# Patient Record
Sex: Female | Born: 1985
Health system: Southern US, Community
[De-identification: ages and names within clinical notes are randomized; demographics above are authoritative.]

## PROBLEM LIST (undated history)

## (undated) DIAGNOSIS — F419 Anxiety disorder, unspecified: Secondary | ICD-10-CM

## (undated) DIAGNOSIS — F32A Depression, unspecified: Secondary | ICD-10-CM

## (undated) DIAGNOSIS — R519 Headache, unspecified: Secondary | ICD-10-CM

## (undated) DIAGNOSIS — K644 Residual hemorrhoidal skin tags: Secondary | ICD-10-CM

## (undated) HISTORY — PX: CLAVICLE SURGERY: SHX598

## (undated) HISTORY — DX: Headache, unspecified: R51.9

## (undated) HISTORY — DX: Anxiety disorder, unspecified: F41.9

## (undated) HISTORY — PX: WISDOM TOOTH EXTRACTION: SHX21

---

## 2020-02-08 ENCOUNTER — Inpatient Hospital Stay (HOSPITAL_COMMUNITY): Admit: 2020-02-08 | Payer: Managed Care, Other (non HMO) | Admitting: Obstetrics and Gynecology

## 2020-02-23 NOTE — L&D Delivery Note (Addendum)
Operative Delivery Note Pt reached complete dilation and FHR noted to have more prolonged decelerations with slow recovery.  She pushed well and brought the vertex to a +2 station after about 40 minutes, but the FHR began to have more prolonged decelerations under 100.  The patient and husband were counseled that we need to expedite delivery.   Verbal consent: obtained from patient.  Risks and benefits discussed in detail.  Risks include, but are not limited to the risks of anesthesia, bleeding, infection, damage to maternal tissues, fetal cephalhematoma.  There is also the risk of inability to effect vaginal delivery of the head, or shoulder dystocia that cannot be resolved by established maneuvers, leading to the need for emergency cesarean section.    A soft bell cup vacuum was appled to a +2 station and in the green zone with no popoffs the vertex delivered to crowning over 3 contractions.  At 12:57 AM a viable female was delivered via Vaginal, Vacuum Investment banker, operational).  Presentation: vertex; Position: Right,, Occiput,, Anterior; Station: +2.  APGAR: 9, 9; weight  pending.   Placenta status: delivered spontaneously.   Cord:  with the following complications: none.  Anesthesia:  Epidural Instruments: Soft Bell cup vacuum Episiotomy: None Lacerations:  Second degree perineal, some mild rectal prolapse Suture Repair: 3.0 vicryl rapide Est. Blood Loss (mL):   Mom to postpartum.  Baby to Couplet care / Skin to Skin.  Parents decline circumcision.  Chelsea Gonzalez 09/11/2020, 1:51 AM

## 2020-02-28 ENCOUNTER — Other Ambulatory Visit: Payer: Self-pay | Admitting: Obstetrics and Gynecology

## 2020-02-28 DIAGNOSIS — O09291 Supervision of pregnancy with other poor reproductive or obstetric history, first trimester: Secondary | ICD-10-CM

## 2020-04-25 ENCOUNTER — Ambulatory Visit: Payer: 59 | Attending: Obstetrics and Gynecology

## 2020-04-25 ENCOUNTER — Other Ambulatory Visit: Payer: Self-pay | Admitting: Obstetrics and Gynecology

## 2020-04-25 ENCOUNTER — Other Ambulatory Visit: Payer: Self-pay

## 2020-04-25 ENCOUNTER — Ambulatory Visit: Payer: 59 | Admitting: *Deleted

## 2020-04-25 ENCOUNTER — Encounter: Payer: Self-pay | Admitting: *Deleted

## 2020-04-25 ENCOUNTER — Ambulatory Visit (HOSPITAL_BASED_OUTPATIENT_CLINIC_OR_DEPARTMENT_OTHER): Payer: 59 | Admitting: Obstetrics and Gynecology

## 2020-04-25 VITALS — BP 107/69 | HR 80 | Ht 69.0 in

## 2020-04-25 DIAGNOSIS — O09529 Supervision of elderly multigravida, unspecified trimester: Secondary | ICD-10-CM

## 2020-04-25 DIAGNOSIS — Z3A19 19 weeks gestation of pregnancy: Secondary | ICD-10-CM | POA: Insufficient documentation

## 2020-04-25 DIAGNOSIS — O09219 Supervision of pregnancy with history of pre-term labor, unspecified trimester: Secondary | ICD-10-CM | POA: Insufficient documentation

## 2020-04-25 DIAGNOSIS — O09212 Supervision of pregnancy with history of pre-term labor, second trimester: Secondary | ICD-10-CM | POA: Diagnosis not present

## 2020-04-25 DIAGNOSIS — O09522 Supervision of elderly multigravida, second trimester: Secondary | ICD-10-CM

## 2020-04-25 DIAGNOSIS — O09291 Supervision of pregnancy with other poor reproductive or obstetric history, first trimester: Secondary | ICD-10-CM | POA: Diagnosis present
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
## 2020-05-16 ENCOUNTER — Other Ambulatory Visit: Payer: Self-pay | Admitting: Obstetrics

## 2020-05-16 ENCOUNTER — Other Ambulatory Visit: Payer: Self-pay

## 2020-05-16 ENCOUNTER — Encounter: Payer: Self-pay | Admitting: *Deleted

## 2020-05-16 ENCOUNTER — Ambulatory Visit: Payer: 59 | Admitting: *Deleted

## 2020-05-16 ENCOUNTER — Ambulatory Visit: Payer: 59 | Attending: Obstetrics and Gynecology

## 2020-05-16 VITALS — BP 115/70 | HR 69

## 2020-05-16 DIAGNOSIS — O09219 Supervision of pregnancy with history of pre-term labor, unspecified trimester: Secondary | ICD-10-CM | POA: Diagnosis not present

## 2020-05-16 DIAGNOSIS — O09299 Supervision of pregnancy with other poor reproductive or obstetric history, unspecified trimester: Secondary | ICD-10-CM

## 2020-05-16 DIAGNOSIS — O09523 Supervision of elderly multigravida, third trimester: Secondary | ICD-10-CM

## 2020-05-16 DIAGNOSIS — O09522 Supervision of elderly multigravida, second trimester: Secondary | ICD-10-CM | POA: Insufficient documentation

## 2020-06-27 ENCOUNTER — Ambulatory Visit: Payer: 59 | Admitting: *Deleted

## 2020-06-27 ENCOUNTER — Other Ambulatory Visit: Payer: Self-pay

## 2020-06-27 ENCOUNTER — Encounter: Payer: Self-pay | Admitting: *Deleted

## 2020-06-27 ENCOUNTER — Ambulatory Visit: Payer: 59 | Attending: Obstetrics

## 2020-06-27 VITALS — BP 110/59 | HR 69

## 2020-06-27 DIAGNOSIS — O09523 Supervision of elderly multigravida, third trimester: Secondary | ICD-10-CM

## 2020-06-27 DIAGNOSIS — O09299 Supervision of pregnancy with other poor reproductive or obstetric history, unspecified trimester: Secondary | ICD-10-CM | POA: Insufficient documentation

## 2020-06-27 DIAGNOSIS — O09219 Supervision of pregnancy with history of pre-term labor, unspecified trimester: Secondary | ICD-10-CM | POA: Insufficient documentation

## 2020-08-22 LAB — OB RESULTS CONSOLE GBS: GBS: NEGATIVE

## 2020-09-03 ENCOUNTER — Telehealth (HOSPITAL_COMMUNITY): Payer: Self-pay | Admitting: *Deleted

## 2020-09-03 ENCOUNTER — Other Ambulatory Visit: Payer: Self-pay | Admitting: Advanced Practice Midwife

## 2020-09-03 ENCOUNTER — Encounter (HOSPITAL_COMMUNITY): Payer: Self-pay | Admitting: *Deleted

## 2020-09-03 NOTE — Telephone Encounter (Signed)
Preadmission screen  

## 2020-09-08 ENCOUNTER — Other Ambulatory Visit (HOSPITAL_COMMUNITY)
Admission: RE | Admit: 2020-09-08 | Discharge: 2020-09-08 | Disposition: A | Discharge status: Home / Self Care | Payer: 59 | Source: Ambulatory Visit | Attending: Obstetrics and Gynecology | Admitting: Obstetrics and Gynecology

## 2020-09-08 DIAGNOSIS — O872 Hemorrhoids in the puerperium: Secondary | ICD-10-CM | POA: Diagnosis not present

## 2020-09-08 DIAGNOSIS — Z3A39 39 weeks gestation of pregnancy: Secondary | ICD-10-CM | POA: Diagnosis not present

## 2020-09-08 DIAGNOSIS — Z8616 Personal history of COVID-19: Secondary | ICD-10-CM | POA: Diagnosis not present

## 2020-09-08 DIAGNOSIS — Z01812 Encounter for preprocedural laboratory examination: Secondary | ICD-10-CM | POA: Insufficient documentation

## 2020-09-08 DIAGNOSIS — Z20822 Contact with and (suspected) exposure to covid-19: Secondary | ICD-10-CM | POA: Insufficient documentation

## 2020-09-08 LAB — SARS CORONAVIRUS 2 (TAT 6-24 HRS): SARS Coronavirus 2: NEGATIVE

## 2020-09-09 ENCOUNTER — Other Ambulatory Visit: Payer: Self-pay | Admitting: Obstetrics and Gynecology

## 2020-09-10 ENCOUNTER — Inpatient Hospital Stay (HOSPITAL_COMMUNITY)
Admission: AD | Admit: 2020-09-10 | Discharge: 2020-09-12 | DRG: 806 | Disposition: A | Discharge status: Home / Self Care | Payer: 59 | Attending: Obstetrics and Gynecology | Admitting: Obstetrics and Gynecology

## 2020-09-10 ENCOUNTER — Encounter (HOSPITAL_COMMUNITY): Payer: Self-pay | Admitting: Obstetrics and Gynecology

## 2020-09-10 ENCOUNTER — Other Ambulatory Visit: Payer: Self-pay

## 2020-09-10 ENCOUNTER — Inpatient Hospital Stay (HOSPITAL_COMMUNITY): Payer: 59

## 2020-09-10 ENCOUNTER — Inpatient Hospital Stay (HOSPITAL_COMMUNITY): Payer: 59 | Admitting: Anesthesiology

## 2020-09-10 DIAGNOSIS — Z3A39 39 weeks gestation of pregnancy: Secondary | ICD-10-CM

## 2020-09-10 DIAGNOSIS — O26893 Other specified pregnancy related conditions, third trimester: Secondary | ICD-10-CM | POA: Diagnosis present

## 2020-09-10 DIAGNOSIS — K645 Perianal venous thrombosis: Secondary | ICD-10-CM | POA: Diagnosis not present

## 2020-09-10 DIAGNOSIS — Z20822 Contact with and (suspected) exposure to covid-19: Secondary | ICD-10-CM | POA: Diagnosis present

## 2020-09-10 DIAGNOSIS — Z8616 Personal history of COVID-19: Secondary | ICD-10-CM | POA: Diagnosis not present

## 2020-09-10 DIAGNOSIS — O872 Hemorrhoids in the puerperium: Secondary | ICD-10-CM | POA: Diagnosis not present

## 2020-09-10 LAB — TYPE AND SCREEN
ABO/RH(D): A POS
Antibody Screen: NEGATIVE

## 2020-09-10 LAB — CBC
HCT: 37.2 % (ref 36.0–46.0)
Hemoglobin: 12.1 g/dL (ref 12.0–15.0)
MCH: 29.9 pg (ref 26.0–34.0)
MCHC: 32.5 g/dL (ref 30.0–36.0)
MCV: 91.9 fL (ref 80.0–100.0)
Platelets: 179 10*3/uL (ref 150–400)
RBC: 4.05 MIL/uL (ref 3.87–5.11)
RDW: 14.2 % (ref 11.5–15.5)
WBC: 14.9 10*3/uL — ABNORMAL HIGH (ref 4.0–10.5)
nRBC: 0 % (ref 0.0–0.2)

## 2020-09-10 MED ORDER — LACTATED RINGERS IV SOLN: INTRAVENOUS | Status: DC

## 2020-09-10 MED ORDER — ONDANSETRON HCL 4 MG/2ML IJ SOLN: 4.0000 mg | Freq: Four times a day (QID) | INTRAMUSCULAR | Status: DC | PRN

## 2020-09-10 MED ORDER — PHENYLEPHRINE 40 MCG/ML (10ML) SYRINGE FOR IV PUSH (FOR BLOOD PRESSURE SUPPORT)
80.0000 ug | PREFILLED_SYRINGE | INTRAVENOUS | Status: DC | PRN
  Filled 2020-09-10: qty 10

## 2020-09-10 MED ORDER — DIPHENHYDRAMINE HCL 50 MG/ML IJ SOLN
12.5000 mg | INTRAMUSCULAR | Status: DC | PRN
Start: 2020-09-10 — End: 2020-09-11

## 2020-09-10 MED ORDER — OXYTOCIN-SODIUM CHLORIDE 30-0.9 UT/500ML-% IV SOLN
2.5000 [IU]/h | INTRAVENOUS | Status: DC
  Administered 2020-09-11: 2.5 [IU]/h via INTRAVENOUS

## 2020-09-10 MED ORDER — LACTATED RINGERS IV SOLN
500.0000 mL | INTRAVENOUS | Status: DC | PRN
Start: 2020-09-10 — End: 2020-09-11

## 2020-09-10 MED ORDER — OXYCODONE-ACETAMINOPHEN 5-325 MG PO TABS
2.0000 | ORAL_TABLET | ORAL | Status: DC | PRN
Start: 2020-09-10 — End: 2020-09-11

## 2020-09-10 MED ORDER — FENTANYL-BUPIVACAINE-NACL 0.5-0.125-0.9 MG/250ML-% EP SOLN
12.0000 mL/h | EPIDURAL | Status: DC | PRN
  Administered 2020-09-10: 13 mL/h via EPIDURAL
  Filled 2020-09-10: qty 250

## 2020-09-10 MED ORDER — ACETAMINOPHEN 325 MG PO TABS
650.0000 mg | ORAL_TABLET | ORAL | Status: DC | PRN
  Administered 2020-09-11: 650 mg via ORAL
  Filled 2020-09-10: qty 2

## 2020-09-10 MED ORDER — BUTORPHANOL TARTRATE 1 MG/ML IJ SOLN: 1.0000 mg | INTRAMUSCULAR | Status: DC | PRN

## 2020-09-10 MED ORDER — OXYTOCIN BOLUS FROM INFUSION
333.0000 mL | Freq: Once | INTRAVENOUS | Status: AC
  Administered 2020-09-11: 333 mL via INTRAVENOUS

## 2020-09-10 MED ORDER — OXYCODONE-ACETAMINOPHEN 5-325 MG PO TABS
1.0000 | ORAL_TABLET | ORAL | Status: DC | PRN
Start: 2020-09-10 — End: 2020-09-11

## 2020-09-10 MED ORDER — TERBUTALINE SULFATE 1 MG/ML IJ SOLN: 0.2500 mg | Freq: Once | INTRAMUSCULAR | Status: DC | PRN

## 2020-09-10 MED ORDER — SOD CITRATE-CITRIC ACID 500-334 MG/5ML PO SOLN: 30.0000 mL | ORAL | Status: DC | PRN

## 2020-09-10 MED ORDER — EPHEDRINE 5 MG/ML INJ: 10.0000 mg | INTRAVENOUS | Status: DC | PRN

## 2020-09-10 MED ORDER — LIDOCAINE HCL (PF) 1 % IJ SOLN
INTRAMUSCULAR | Status: DC | PRN
  Administered 2020-09-10: 5 mL via EPIDURAL
  Administered 2020-09-10: 4 mL via EPIDURAL

## 2020-09-10 MED ORDER — PHENYLEPHRINE 40 MCG/ML (10ML) SYRINGE FOR IV PUSH (FOR BLOOD PRESSURE SUPPORT)
80.0000 ug | PREFILLED_SYRINGE | INTRAVENOUS | Status: DC | PRN
  Administered 2020-09-10 (×2): 80 ug via INTRAVENOUS

## 2020-09-10 MED ORDER — LIDOCAINE HCL (PF) 1 % IJ SOLN: 30.0000 mL | INTRAMUSCULAR | Status: DC | PRN

## 2020-09-10 MED ORDER — LACTATED RINGERS IV SOLN
500.0000 mL | Freq: Once | INTRAVENOUS | Status: AC
  Administered 2020-09-10: 500 mL via INTRAVENOUS

## 2020-09-10 MED ORDER — OXYTOCIN-SODIUM CHLORIDE 30-0.9 UT/500ML-% IV SOLN
1.0000 m[IU]/min | INTRAVENOUS | Status: DC
  Administered 2020-09-10: 2 m[IU]/min via INTRAVENOUS
  Filled 2020-09-10: qty 500

## 2020-09-10 NOTE — Progress Notes (Signed)
Patient ID: Chelsea Gonzalez, female   DOB: Jul 07, 1985, 35 y.o.   MRN: [id number] Pt comfortable with epidural  Afeb VSS  FHR category 1  Cervix 70/3-4/-1  IUPC placed to titrate up pitocin Follow progress.

## 2020-09-10 NOTE — Anesthesia Preprocedure Evaluation (Signed)
Anesthesia Evaluation  Patient identified by MRN, date of birth, ID band Patient awake    Reviewed: Allergy & Precautions, Patient's Chart, lab work & pertinent test results  Airway Mallampati: II  TM Distance: >3 FB Neck ROM: Full    Dental no notable dental hx. (+) Teeth Intact   Pulmonary neg pulmonary ROS,    Pulmonary exam normal breath sounds clear to auscultation       Cardiovascular negative cardio ROS Normal cardiovascular exam Rhythm:Regular Rate:Normal     Neuro/Psych  Headaches, Anxiety    GI/Hepatic Neg liver ROS, GERD  ,  Endo/Other  Obesity  Renal/GU negative Renal ROS  negative genitourinary   Musculoskeletal negative musculoskeletal ROS (+)   Abdominal (+) + obese,   Peds  Hematology negative hematology ROS (+)   Anesthesia Other Findings   Reproductive/Obstetrics (+) Pregnancy                             Anesthesia Physical Anesthesia Plan  ASA: 2  Anesthesia Plan: Epidural   Post-op Pain Management:    Induction:   PONV Risk Score and Plan:   Airway Management Planned: Natural Airway  Additional Equipment:   Intra-op Plan:   Post-operative Plan:   Informed Consent: I have reviewed the patients History and Physical, chart, labs and discussed the procedure including the risks, benefits and alternatives for the proposed anesthesia with the patient or authorized representative who has indicated his/her understanding and acceptance.       Plan Discussed with: Anesthesiologist  Anesthesia Plan Comments:         Anesthesia Quick Evaluation

## 2020-09-10 NOTE — Progress Notes (Signed)
Patient ID: Chelsea Gonzalez, female   DOB: 01/12/1986, 35 y.o.   MRN: [id number] Pt feeling a bit more uncomfortable, may want epidural in next hour  FHR Category 1  Cervix 70/2-3/-2  Continue to titrate pitocin Epidural prn.
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
## 2020-09-10 NOTE — H&P (Signed)
Chelsea Gonzalez is a 35 y.o. female G2P0101 at 4 1/7 weeks (EDD 09/16/20 by LMP c/w 8 week [place]) presenting for IOL at term. Prenatal care significant GUR:KYHCWCB of premature delivery   1) h/o Preterm Delivery PPROM at 69 weeks,in hospital until induced at 34 weeks (NICU 2 1/2 weeks) Pt did 17-P weekly at home 16-37 weeks   2) Advanced maternal age gravida  NIPT low risk   3) COVID 2/22  mild/moderate symptoms (was vaccinated)  4) h/o rectal prolapse following last delivery requires pelvic PT  d/w Dr. Maisie Fus of [facility] and she states probably 50/50 as to whether a vaginal delivery would make prolapse worse, more likely with prolonged pushing but does not think contraindicated  OB History     Gravida  2   Para  1   Term      Preterm  1   AB      Living  1      SAB      IAB      Ectopic      Multiple      Live Births  1         05-05-2017, 34 wks M, 3lbs 14oz, Vaginal Delivery  Past Medical History:  Diagnosis Date   Anxiety    Headache    migraine w/aura occas   Past Surgical History:  Procedure Laterality Date   CLAVICLE SURGERY     WISDOM TOOTH EXTRACTION     Family History: family history includes Cancer in her mother; Diabetes in her mother; Hypertension in her father and mother; Hypothyroidism in her mother; Stroke in her father. Social History:  reports that she has never smoked. She has never used smokeless tobacco. She reports that she does not drink alcohol and does not use drugs.     Maternal Diabetes: No Genetic Screening: Normal Maternal Ultrasounds/Referrals: Normal Fetal Ultrasounds or other Referrals:  None Maternal Substance Abuse:  No Significant Maternal Medications:  None Significant Maternal Lab Results:  Group B Strep negative Other Comments:  None  Review of Systems  Constitutional:  Negative for fever.  Respiratory:  Negative for shortness of breath.   Gastrointestinal:  Negative for abdominal pain.  Genitourinary:   Negative for vaginal discharge.  Maternal Medical History:  Contractions: Frequency: irregular.   Perceived severity is mild.   Fetal activity: Perceived fetal activity is normal.   Prenatal complications: AMA, h/o PTD Prenatal Complications - Diabetes: none.  Dilation: 2 Effacement (%): 50 Station: -2 Exam by:: Dr Senaida Ores AROM clear Blood pressure 108/78, pulse 76, temperature 98 F (36.7 C), temperature source Axillary, resp. rate 16, height 5\' 9"  (1.753 m), weight 106.9 kg, last menstrual period 12/11/2019. Maternal Exam:  Uterine Assessment: Contraction frequency is irregular.  Abdomen: Patient reports no abdominal tenderness. Fetal presentation: vertex Introitus: Normal vulva. Normal vagina.   Physical Exam Constitutional:      Appearance: Normal appearance.  Cardiovascular:     Rate and Rhythm: Normal rate and regular rhythm.  Pulmonary:     Effort: Pulmonary effort is normal.  Abdominal:     Palpations: Abdomen is soft.  Genitourinary:    General: Normal vulva.  Neurological:     Mental Status: She is alert.  Psychiatric:        Mood and Affect: Mood normal.    Prenatal labs: ABO, Rh: --/--/A POS (07/20 1533) Antibody: NEG (07/20 1533) Rubella:  Immune RPR:   NR HBsAg:   Neg HIV:   NR GBS:  Neg NIPT low risk Carrier screen negative One hour GCT 158 3 hour GTT WNL  Assessment/Plan: Pt admitted later in day for IOL as L&D busy.  On pitocin at 4 mu.  Continue to titrate up.  Epidural prn.  Oliver Pila 09/10/2020, 5:56 PM

## 2020-09-10 NOTE — Progress Notes (Signed)
Patient ID: Chelsea Gonzalez, female   DOB: Nov 17, 1985, 35 y.o.   MRN: [id number] Pt feeling some pressure  Afeb VSS FHR with good variability and some early mild decelerations  80/7/0  Continue to follow progress.

## 2020-09-10 NOTE — Anesthesia Procedure Notes (Signed)
Epidural Patient location during procedure: OB Start time: 09/10/2020 8:25 PM End time: 09/10/2020 8:35 PM  Preanesthetic Checklist Completed: patient identified, IV checked, site marked, risks and benefits discussed, surgical consent, monitors and equipment checked, pre-op evaluation and timeout performed  Epidural Patient position: sitting Prep: DuraPrep and site prepped and draped Patient monitoring: continuous pulse ox and blood pressure Approach: midline Location: L4-L5 Injection technique: LOR air  Needle:  Needle type: Tuohy  Needle gauge: 17 G Needle length: 9 cm and 9 Needle insertion depth: 5 cm Catheter type: closed end flexible Catheter size: 19 Gauge Catheter at skin depth: 10 cm Test dose: negative and Other  Assessment Events: blood not aspirated, injection not painful, no injection resistance, no paresthesia and negative IV test  Additional Notes Patient identified. Risks and benefits discussed including failed block, incomplete  Pain control, post dural puncture headache, nerve damage, paralysis, blood pressure Changes, nausea, vomiting, reactions to medications-both toxic and allergic and post Partum back pain. All questions were answered. Patient expressed understanding and wished to proceed. Sterile technique was used throughout procedure. Epidural site was Dressed with sterile barrier dressing. No paresthesias, signs of intravascular injection Or signs of intrathecal spread were encountered.  Patient was more comfortable after the epidural was dosed. Please see RN's note for documentation of vital signs and FHR which are stable.  Reason for block:procedure for pain

## 2020-09-10 NOTE — Progress Notes (Signed)
Pt comfortable, states she "feels fine now"

## 2020-09-11 ENCOUNTER — Encounter (HOSPITAL_COMMUNITY): Payer: Self-pay | Admitting: Obstetrics and Gynecology

## 2020-09-11 LAB — CBC
HCT: 34.2 % — ABNORMAL LOW (ref 36.0–46.0)
Hemoglobin: 11.3 g/dL — ABNORMAL LOW (ref 12.0–15.0)
MCH: 30.4 pg (ref 26.0–34.0)
MCHC: 33 g/dL (ref 30.0–36.0)
MCV: 91.9 fL (ref 80.0–100.0)
Platelets: 153 10*3/uL (ref 150–400)
RBC: 3.72 MIL/uL — ABNORMAL LOW (ref 3.87–5.11)
RDW: 14.3 % (ref 11.5–15.5)
WBC: 20.3 10*3/uL — ABNORMAL HIGH (ref 4.0–10.5)
nRBC: 0 % (ref 0.0–0.2)

## 2020-09-11 LAB — RPR: RPR Ser Ql: NONREACTIVE

## 2020-09-11 MED ORDER — IBUPROFEN 600 MG PO TABS
600.0000 mg | ORAL_TABLET | Freq: Four times a day (QID) | ORAL | Status: DC
  Administered 2020-09-11 (×3): 600 mg via ORAL
  Filled 2020-09-11 (×3): qty 1

## 2020-09-11 MED ORDER — BENZOCAINE-MENTHOL 20-0.5 % EX AERO
1.0000 "application " | INHALATION_SPRAY | CUTANEOUS | Status: DC | PRN
  Filled 2020-09-11: qty 56

## 2020-09-11 MED ORDER — DOCUSATE SODIUM 100 MG PO CAPS
100.0000 mg | ORAL_CAPSULE | Freq: Two times a day (BID) | ORAL | Status: DC
  Administered 2020-09-11 – 2020-09-12 (×3): 100 mg via ORAL
  Filled 2020-09-11 (×3): qty 1

## 2020-09-11 MED ORDER — HYDROCORTISONE (PERIANAL) 2.5 % EX CREA
TOPICAL_CREAM | Freq: Three times a day (TID) | CUTANEOUS | Status: DC
  Filled 2020-09-11: qty 28.35

## 2020-09-11 MED ORDER — TETANUS-DIPHTH-ACELL PERTUSSIS 5-2.5-18.5 LF-MCG/0.5 IM SUSY: 0.5000 mL | PREFILLED_SYRINGE | Freq: Once | INTRAMUSCULAR | Status: DC

## 2020-09-11 MED ORDER — ZOLPIDEM TARTRATE 5 MG PO TABS
5.0000 mg | ORAL_TABLET | Freq: Every evening | ORAL | Status: DC | PRN
  Administered 2020-09-11: 5 mg via ORAL
  Filled 2020-09-11: qty 1

## 2020-09-11 MED ORDER — COCONUT OIL OIL
1.0000 "application " | TOPICAL_OIL | Status: DC | PRN
  Administered 2020-09-11: 1 via TOPICAL

## 2020-09-11 MED ORDER — OXYCODONE-ACETAMINOPHEN 5-325 MG PO TABS
1.0000 | ORAL_TABLET | ORAL | Status: DC | PRN
  Administered 2020-09-11: 1 via ORAL
  Filled 2020-09-11: qty 1

## 2020-09-11 MED ORDER — LIDOCAINE 4 % EX CREA
TOPICAL_CREAM | Freq: Three times a day (TID) | CUTANEOUS | Status: DC | PRN
  Filled 2020-09-11: qty 5

## 2020-09-11 MED ORDER — WITCH HAZEL-GLYCERIN EX PADS: 1.0000 "application " | MEDICATED_PAD | CUTANEOUS | Status: DC | PRN

## 2020-09-11 MED ORDER — DIPHENHYDRAMINE HCL 25 MG PO CAPS: 25.0000 mg | ORAL_CAPSULE | Freq: Four times a day (QID) | ORAL | Status: DC | PRN

## 2020-09-11 MED ORDER — SENNOSIDES-DOCUSATE SODIUM 8.6-50 MG PO TABS
2.0000 | ORAL_TABLET | ORAL | Status: DC
  Administered 2020-09-11: 2 via ORAL
  Filled 2020-09-11 (×2): qty 2

## 2020-09-11 MED ORDER — POLYETHYLENE GLYCOL 3350 17 G PO PACK
17.0000 g | PACK | Freq: Every day | ORAL | Status: DC
  Administered 2020-09-12: 17 g via ORAL
  Filled 2020-09-11 (×2): qty 1

## 2020-09-11 MED ORDER — DIBUCAINE (PERIANAL) 1 % EX OINT: 1.0000 "application " | TOPICAL_OINTMENT | CUTANEOUS | Status: DC | PRN

## 2020-09-11 MED ORDER — SIMETHICONE 80 MG PO CHEW: 80.0000 mg | CHEWABLE_TABLET | ORAL | Status: DC | PRN

## 2020-09-11 MED ORDER — ACETAMINOPHEN 325 MG PO TABS
650.0000 mg | ORAL_TABLET | ORAL | Status: DC | PRN
  Administered 2020-09-11 (×2): 650 mg via ORAL
  Filled 2020-09-11 (×2): qty 2

## 2020-09-11 MED ORDER — PRENATAL MULTIVITAMIN CH
1.0000 | ORAL_TABLET | Freq: Every day | ORAL | Status: DC
  Administered 2020-09-11 – 2020-09-12 (×2): 1 via ORAL
  Filled 2020-09-11 (×2): qty 1

## 2020-09-11 MED ORDER — ONDANSETRON HCL 4 MG PO TABS: 4.0000 mg | ORAL_TABLET | ORAL | Status: DC | PRN

## 2020-09-11 MED ORDER — KETOROLAC TROMETHAMINE 30 MG/ML IJ SOLN
30.0000 mg | Freq: Four times a day (QID) | INTRAMUSCULAR | Status: DC | PRN
  Administered 2020-09-11 – 2020-09-12 (×2): 30 mg via INTRAVENOUS
  Filled 2020-09-11 (×2): qty 1

## 2020-09-11 MED ORDER — ONDANSETRON HCL 4 MG/2ML IJ SOLN: 4.0000 mg | INTRAMUSCULAR | Status: DC | PRN

## 2020-09-11 NOTE — Progress Notes (Addendum)
Patient ID: Chelsea Gonzalez, female   DOB: Mar 13, 1985, 35 y.o.   MRN: [id number] Pt with complaint of painful rectal prolapse. She reports has had to manually reduce it after BMs during pregnancy.  Pt delivered at 1am today and reports has had difficulty sitting on the toilet to void and tolerating rectal pressure even at rest due to worsening of rectal prolapse. She reports this is the "worst it has been" . She has not been able to reduce it manually this time due to the discomfort. She also feels like she has no rectal tone to help aid her control keeping the prolapse in after reduction. Pt has worked with a pelvic floor therapist in past ( after first delivery) and is familiar with rectal kegels.  She reports some mild relief with ice pack. She is otherwise doing well postpartum and bonding with baby.   VSS:  GEN - discomfort noted on exam as expected GU - 3cm prolapsed taut tissue noted from rectum, unable to reduce           due to pt discomfort, mild bleeding noted; lochia mild EXT - no homans  A/P: PPD#0 s/p svd  Worsening rectal prolapse ( hemorrhoid vs rectum)         Consult placed to [facility] : recommendations to place pt flat, administer pain medication for relief, ice pack to site. Pt will be seen later today for further recommendations           - will order lidocaine cream prn   2     Recovering well otherwise pp

## 2020-09-11 NOTE — Lactation Note (Signed)
This note was copied from a baby's chart. Lactation Consultation Note Baby was 1 hr old at time of consult. Mom finishing up eating when LC came into rm. In cradle position assisted in latching baby. Lt. Nipple Lg. everted elongated and slightly curved. [name] is 1/2 the size of the Rt. Rt. Nipple Lg. Everted full nipple.  Baby will not open wide at this time. Difficulty getting baby to open wide. When baby gets on nipple closes at the base of nipple. Not able to flange wide for areola. Mom stated feels like clamping. Discussed w/mom until the baby learns to open wider and maybe grows about a week or so where the baby's mouth is bigger, LC feels that baby will be able to latch better.  Stress the importance of not letting baby suckle if it is hurting. Attempt to widen flange. BF STS. Positions and support mentioned.  Will set up DEBP for mom so she can pump and bottle feed until baby is able to take the nipple better.  If baby needs to be supplemented mom would like Donor milk if she doesn't have enough colostrum. Mom will still attempt to put baby to the breast in  hopes that the baby will widen flange and suckle to breast.  Will f.u on floor.  Patient Name: Chelsea Gonzalez [id number] Date: 09/11/2020 Reason for consult: L&D Initial assessment;Term Age:51 hours  Maternal Data Has patient been taught Hand Expression?: Yes Does the patient have breastfeeding experience prior to this delivery?: Yes How long did the patient breastfeed?: 1 yr  Feeding    LATCH Score Latch: Repeated attempts needed to sustain latch, nipple held in mouth throughout feeding, stimulation needed to elicit sucking reflex.  Audible Swallowing: None  Type of Nipple: Everted at rest and after stimulation  Comfort (Breast/Nipple): Soft / non-tender  Hold (Positioning): Assistance needed to correctly position infant at breast and maintain latch.  LATCH Score: 6   Lactation Tools Discussed/Used     Interventions    Discharge WIC Program: No  Consult Status Consult Status: Follow-up Date: 09/11/20 Follow-up type: In-patient    Charyl Dancer 09/11/2020, 2:59 AM

## 2020-09-11 NOTE — Lactation Note (Signed)
This note was copied from a baby's chart. Lactation Consultation Note  Patient Name: Chelsea Gonzalez IDCVU'D Date: 09/11/2020 Reason for consult: Mother's request Age:37 hours  LC in to room per mother's request. Mother is attempting latch side-lying position to right breast. Mother reports cramping with latch and difficulty latching. Mother is unable to seat. Infant is actively cueing and seems hungry. Mother changed sides, LC assisted with successful latch. Unable to note swallowing. LC assisted with hand expression, unable to see colostrum. Mother explains she pumped a couple of times but unable to collect any colostrum.  Discussed feeding alternatives, parents are open to donor milk. Betsy McKinnon-RN assisted with consent. FOB will bottlefeed  this time. LC will come back to teach how supplement using SNS.    Maternal Data Has patient been taught Hand Expression?: Yes  Feeding Mother's Current Feeding Choice: Breast Milk and Donor Milk  LATCH Score Latch: Grasps breast easily, tongue down, lips flanged, rhythmical sucking.  Audible Swallowing: None  Type of Nipple: Everted at rest and after stimulation (large nipples)  Comfort (Breast/Nipple): Soft / non-tender  Hold (Positioning): Assistance needed to correctly position infant at breast and maintain latch.  LATCH Score: 7   Lactation Tools Discussed/Used Tools: Pump  Interventions Interventions: Breast feeding basics reviewed;Assisted with latch;Hand express;Breast massage;Education;DEBP;Adjust position;Support pillows  Discharge Pump: DEBP;Manual  Consult Status Consult Status: Follow-up Date: 09/12/20 Follow-up type: In-patient    Chelsea Gonzalez 09/11/2020, 6:11 PM

## 2020-09-11 NOTE — Consult Note (Signed)
Chelsea Gonzalez 04/15/1985  [id number].    Requesting MD: Dr. Huel Cote Chief Complaint/Reason for Consult: rectal prolapse  HPI:  This is a 35 yo white female with no significant PMH who delivered a 3lb 14 oz child 3 years ago.  After this she began having issues with hemorrhoids as well as self diagnosed rectal prolapse.  She states she has a BM daily and after each one has to push what has "telescoped" out back in.  In general it is not something she has wanted to get "fixed" as she knew she would be having a second child and didn't want to correct the problem to have it happen again.  She has now delivered her second child early this morning and is having significant rectal pain.  She is unable to reduce what has prolapsed out.  We have been asked to see the patient for further evaluation and recommendations.    ROS: ROS: Please see HPI, otherwise all other systems have been reviewed and are negative except normal post vaginal delivery things such as lochia and vaginal pain.  Family History  Problem Relation Age of Onset   Hypertension Mother    Diabetes Mother    Hypothyroidism Mother    Cancer Mother    Hypertension Father    Stroke Father     Past Medical History:  Diagnosis Date   Anxiety    Headache    migraine w/aura occas    Past Surgical History:  Procedure Laterality Date   CLAVICLE SURGERY     WISDOM TOOTH EXTRACTION      Social History:  reports that she has never smoked. She has never used smokeless tobacco. She reports that she does not drink alcohol and does not use drugs.  Allergies: No Known Allergies  Medications Prior to Admission  Medication Sig Dispense Refill   docusate sodium (COLACE) 100 MG capsule Take 100 mg by mouth 2 (two) times daily.     Prenatal Vit-Fe Fumarate-FA (PRENATAL VITAMINS PO) Take by mouth.     VITAMIN D PO Take by mouth.     hydroxyprogesterone caproate (MAKENA) 250 mg/mL OIL injection Inject 250 mg into the  muscle once.     levocetirizine (XYZAL) 5 MG tablet Take 5 mg by mouth every evening.       Physical Exam: Blood pressure 116/76, pulse 78, temperature 98.5 F (36.9 C), temperature source Axillary, resp. rate 16, height 5\' 9"  (1.753 m), weight 106.9 kg, last menstrual period 12/11/2019, SpO2 97 %, unknown if currently breastfeeding. General: pleasant, WD, WN white female who is laying in bed in NAD HEENT: head is normocephalic, atraumatic.  Sclera are noninjected.  PERRL.  Ears and nose without any masses or lesions.  Mouth is pink and moist Heart: regular, rate, and rhythm.  Normal s1,s2. No obvious murmurs, gallops, or rubs noted.  Palpable radial and pedal pulses bilaterally Lungs: CTAB, no wheezes, rhonchi, or rales noted.  Respiratory effort nonlabored Abd: soft, appropriately post partum distention, +BS, no masses, hernias, or organomegaly Rectal: large external hemorrhoids that appear may be thrombosing.  They are very tender, but still somewhat soft.  See pictures below.  There is NO evidence of rectal prolapse  MS: all 4 extremities are symmetrical with no cyanosis, clubbing, or edema. Skin: warm and dry with no masses, lesions, or rashes Neuro: Cranial nerves 2-12 grossly intact, sensation is normal throughout Psych: A&Ox3 with an appropriate affect.   Results for orders placed or performed during  the hospital encounter of 09/10/20 (from the past 48 hour(s))  CBC     Status: Abnormal   Collection Time: 09/10/20  3:30 PM  Result Value Ref Range   WBC 14.9 (H) 4.0 - 10.5 K/uL   RBC 4.05 3.87 - 5.11 MIL/uL   Hemoglobin 12.1 12.0 - 15.0 g/dL   HCT 84.6 96.2 - 95.2 %   MCV 91.9 80.0 - 100.0 fL   MCH 29.9 26.0 - 34.0 pg   MCHC 32.5 30.0 - 36.0 g/dL   RDW 84.1 32.4 - 40.1 %   Platelets 179 150 - 400 K/uL   nRBC 0.0 0.0 - 0.2 %    Comment: Performed at [facility], [address], [place], [place] [address]  Type and screen     Status: None   Collection Time: 09/10/20   3:33 PM  Result Value Ref Range   ABO/RH(D) A POS    Antibody Screen NEG    Sample Expiration      09/13/2020,2359 Performed at [facility], [address] [place], [place] [address]   CBC     Status: Abnormal   Collection Time: 09/11/20  5:28 AM  Result Value Ref Range   WBC 20.3 (H) 4.0 - 10.5 K/uL   RBC 3.72 (L) 3.87 - 5.11 MIL/uL   Hemoglobin 11.3 (L) 12.0 - 15.0 g/dL   HCT 03.4 (L) 74.2 - 59.5 %   MCV 91.9 80.0 - 100.0 fL   MCH 30.4 26.0 - 34.0 pg   MCHC 33.0 30.0 - 36.0 g/dL   RDW 63.8 75.6 - 43.3 %   Platelets 153 150 - 400 K/uL   nRBC 0.0 0.0 - 0.2 %    Comment: Performed at [facility], [address] [place], [place] [address]   No results found.    Assessment/Plan External hemorrhoids The patient currently has no evidence of rectal prolapse.  Her history certainly sounds like this is a possibility, but could also be prolapsed hemorrhoids as well.  She will need to follow up with our colorectal specialist upon discharge for further evaluation.  For now, we will treat these conservatively with ice, anusol, sitz bathes, stool softeners.  We will re-evaluate this in the morning.  If this is no better, then we will consider I&D at bedside vs in the OR.  This has all been discussed with the patient and her husband who are in agreement.  FEN - regular, NPO p MN VTE - per primary ID - none currently  Letha Cape, [facility] 09/11/2020, 11:36 AM Please see Amion for pager number during day hours 7:00am-4:30pm or 7:00am -11:30am on weekends

## 2020-09-11 NOTE — Plan of Care (Signed)
  Problem: Education: Goal: Knowledge of Childbirth will improve Outcome: Adequate for Discharge Goal: Ability to make informed decisions regarding treatment and plan of care will improve Outcome: Adequate for Discharge Goal: Ability to state and carry out methods to decrease the pain will improve Outcome: Adequate for Discharge Goal: Individualized Educational Video(s) Outcome: Not Applicable   Problem: Coping: Goal: Ability to verbalize concerns and feelings about labor and delivery will improve Outcome: Adequate for Discharge   Problem: Life Cycle: Goal: Ability to make normal progression through stages of labor will improve Outcome: Completed/Met Goal: Ability to effectively push during vaginal delivery will improve Outcome: Completed/Met

## 2020-09-11 NOTE — Lactation Note (Signed)
This note was copied from a baby's chart. Lactation Consultation Note  Patient Name: Chelsea Gonzalez Date: 09/11/2020 Reason for consult: Initial assessment;Difficult latch;Term Age:37 hours  Mom is committed to breastfeeding "Chelsea Gonzalez".  She has a 36 year old she pumped for (NICU) and breastfed.  Her first child had a tongue tie revision.  She has a personal pump and is familiar with the [employer] symphony. She knows how to hand express.  She is concerned about her milk volume coming in...with her previous child it took a week.    LC assisted with latching infant.  Infant does not open his mouth wide and tongue extends only to gumline during assessment.  When placed STS to feed, infant would open and latch but was too sleepy to begin sucking.   Mom has larger nipples that seem difficult for infant to grasp.  LC suggested hand expressing and feeding colostrum to infant.  LC also suggested when infant wakes, to call out for assistance with latching.  A nipple shield may be used for the next feeding if infant has trouble latching.  LC discussed this with the family.    Mom agreed to having a pump set up for added stimulation.  Mom used 27 previously.  LC brought 27 and 30 and mom felt the 27 were more comfortable.  Mom requested coconut oil which was brought to the room to use for pumping.    Pump set up, parts, cleaning, and storage reviewd.  Pump was started and LC observed flange fit.  Mom was encouraged to increase to the 30 if pinching was felt or discoloration or the nipple or discomfort was felt at all.    Brochure was provided and mom is aware of OP LC services and [facility] and phone line.    Maternal Data Has patient been taught Hand Expression?: Yes Does the patient have breastfeeding experience prior to this delivery?: Yes How long did the patient breastfeed?: NICU baby, 34wk, pumped for three months, then latched and BF for a year  Feeding Mother's Current Feeding Choice: Breast  Milk  LATCH Score Latch: Too sleepy or reluctant, no latch achieved, no sucking elicited.  Audible Swallowing: None  Type of Nipple: Everted at rest and after stimulation (left nipple shaped more cylindrical than rounded)  Comfort (Breast/Nipple): Soft / non-tender  Hold (Positioning): Assistance needed to correctly position infant at breast and maintain latch.  LATCH Score: 5   Lactation Tools Discussed/Used Tools: Flanges;Pump Flange Size: 27;30 Breast pump type: Double-Electric Breast Pump Pump Education: Setup, frequency, and cleaning;Milk Storage Reason for Pumping: stimulate milk supply/collect for supplementation Pumping frequency: encouraged after BF or every 3 hours  Interventions Interventions: Breast feeding basics reviewed;Skin to skin;Support pillows;Position options;Hand express;Education;DEBP  Discharge Pump: DEBP;Personal WIC Program: No  Consult Status Consult Status: Follow-up Date: 09/12/20 Follow-up type: In-patient    Chelsea Gonzalez [facility] 09/11/2020, 3:16 PM

## 2020-09-11 NOTE — Anesthesia Postprocedure Evaluation (Signed)
Anesthesia Post Note  Patient: Chelsea Gonzalez  Procedure(s) Performed: AN AD HOC LABOR EPIDURAL     Patient location during evaluation: Mother Baby Anesthesia Type: Epidural Level of consciousness: awake and alert Pain management: pain level controlled Vital Signs Assessment: post-procedure vital signs reviewed and stable Respiratory status: spontaneous breathing, nonlabored ventilation and respiratory function stable Cardiovascular status: stable Postop Assessment: no headache, no backache, epidural receding, no apparent nausea or vomiting, patient able to bend at knees, adequate PO intake and able to ambulate Anesthetic complications: no   No notable events documented.  Last Vitals:  Vitals:   09/11/20 0422 09/11/20 0806  BP: 102/68 116/76  Pulse: 80 78  Resp: 18 16  Temp: 36.9 C   SpO2:  97%    Last Pain:  Vitals:   09/11/20 0806  TempSrc:   PainSc: 8    Pain Goal: Patients Stated Pain Goal: 0 (09/10/20 1731)                 Laban Emperor

## 2020-09-11 NOTE — Progress Notes (Signed)
Patient ID: Chelsea Gonzalez, female   DOB: 1985-11-23, 35 y.o.   MRN: [id number] Pt complains of distress related to prolapsed hemorrhoid. States she is unable to stand up, sit, take a shower, have a BM; pressure during voiding is uncomfortable. She reports minimal pain relief from lidocaine cream. She has not taken any narcotic medication for fear of getting constipated  GEN - pt standing in bathroom attempting to use it   Plan : I encouraged pt to take narcotic pain medication for pain relief - not likely to cause constipation over course of the night          Per pt request I will consult with [facility] again to see if able to lance hemorrhoid tonight instead of in am .

## 2020-09-12 ENCOUNTER — Encounter (HOSPITAL_COMMUNITY)
Admission: AD | Disposition: A | Discharge status: Home / Self Care | Payer: Self-pay | Source: Home / Self Care | Attending: Obstetrics and Gynecology

## 2020-09-12 SURGERY — HEMORRHOIDECTOMY
Anesthesia: General

## 2020-09-12 MED ORDER — IBUPROFEN 600 MG PO TABS: 600.0000 mg | ORAL_TABLET | Freq: Four times a day (QID) | ORAL | 0 refills | Status: AC

## 2020-09-12 MED ORDER — KETOROLAC TROMETHAMINE 10 MG PO TABS: 10.0000 mg | ORAL_TABLET | Freq: Four times a day (QID) | ORAL | 0 refills | Status: DC | PRN

## 2020-09-12 MED ORDER — OXYCODONE HCL 5 MG PO TABS: 5.0000 mg | ORAL_TABLET | ORAL | 0 refills | Status: DC | PRN

## 2020-09-12 MED ORDER — ACETAMINOPHEN 500 MG PO TABS
1000.0000 mg | ORAL_TABLET | Freq: Four times a day (QID) | ORAL | Status: DC
  Administered 2020-09-12: 1000 mg via ORAL
  Filled 2020-09-12: qty 2

## 2020-09-12 MED ORDER — KETOROLAC TROMETHAMINE 10 MG PO TABS
10.0000 mg | ORAL_TABLET | Freq: Four times a day (QID) | ORAL | Status: DC | PRN
  Administered 2020-09-12: 10 mg via ORAL
  Filled 2020-09-12 (×2): qty 1

## 2020-09-12 MED ORDER — HYDROCORTISONE (PERIANAL) 2.5 % EX CREA: TOPICAL_CREAM | Freq: Three times a day (TID) | CUTANEOUS | 1 refills | Status: DC

## 2020-09-12 MED ORDER — LIDOCAINE 4 % EX CREA: TOPICAL_CREAM | Freq: Three times a day (TID) | CUTANEOUS | 1 refills | Status: DC | PRN

## 2020-09-12 MED ORDER — ACETAMINOPHEN 500 MG PO TABS: 1000.0000 mg | ORAL_TABLET | Freq: Four times a day (QID) | ORAL | 0 refills | Status: AC

## 2020-09-12 MED ORDER — OXYCODONE HCL 5 MG PO TABS
5.0000 mg | ORAL_TABLET | ORAL | Status: DC | PRN
Start: 2020-09-12 — End: 2020-09-12
  Administered 2020-09-12: 5 mg via ORAL
  Filled 2020-09-12: qty 1

## 2020-09-12 NOTE — Progress Notes (Signed)
PPD #1 Bottom is still sore but a little better since she was able to reduce hemorrhoids, IV toradol helped with pain Afeb, VSS Fundus firm D/c home today, will try PO toradol, appreciate surgery eval

## 2020-09-12 NOTE — Lactation Note (Signed)
This note was copied from a baby's chart. Lactation Consultation Note  Patient Name: Chelsea Gonzalez [name] Date: 09/12/2020 Reason for consult: Follow-up assessment;Term;Difficult latch Age:37 hours  P2 mother whose infant is now 36 hours old.  This is a term baby at 39+2 weeks.  Mother pumped for 3 weeks with her 34 week first born and then breast fed for one year. Mother's feeding preference is breast and supplementation with donor breast milk.  Mother has had considerable amount of pain and has not been able to consistently work on breast feeding; stated baby went to the nursery last night so mother could rest.  She did not pump during the night when baby was in the nursery.  Asked mother to demonstrate hand expression but she was unable to express colostrum at this time.  Mother has a tight suck on my gloved finger.  Allowed time for suck training and used curved tip syringe to get baby to begin sucking well.  Transferred to the breast where he was able to latch, however, was not interested in sustaining a latch.  With father using the curved tip syringe, he would suck and latch without difficulty.  As soon as the milk was gone he became frustrated.  Repeated attempts made and baby consumed approximately 6 mls at the breast.  Finished feeding using the cup feeder and the purple extra slow flow nipple.  Baby did well with the extra slow flow nipple and provided a few for home use.    Reviewed feeding plan encouraging continued practice at the breast followed by 30+ mls of supplementation.  Parents will stop by their pediatrician's office today to obtain formula.  Father to go out and purchase more as needed.  Mother will continue to pump every three hours after attempting breast feeding.    Mother has a DEBP for home use.  Parents desire a discharge today.  RN and MD notified.  Maternal Data    Feeding Mother's Current Feeding Choice: Breast Milk and Donor Milk Nipple Type: Extra  Slow Flow  LATCH Score Latch: Repeated attempts needed to sustain latch, nipple held in mouth throughout feeding, stimulation needed to elicit sucking reflex.  Audible Swallowing: A few with stimulation (With donor breast milk)  Type of Nipple: Everted at rest and after stimulation  Comfort (Breast/Nipple): Soft / non-tender  Hold (Positioning): Assistance needed to correctly position infant at breast and maintain latch.  LATCH Score: 7   Lactation Tools Discussed/Used Tools: Pump;Flanges;Feeding cup Flange Size: 27;30 Breast pump type: Double-Electric Breast Pump;Manual Pump Education: Setup, frequency, and cleaning;Milk Storage Reason for Pumping: Supplementation for baby; breast stimulation Pumping frequency: Every three hours  Interventions Interventions: Breast feeding basics reviewed;Assisted with latch;Skin to skin;Breast massage;Hand express;Breast compression;Adjust position;DEBP;Hand pump;Position options;Support pillows;Education  Discharge Discharge Education: Engorgement and breast care Pump: DEBP;Manual;Personal WIC Program: No  Consult Status Consult Status: Complete Date: 09/12/20 Follow-up type: Call as needed    Chelsea Gonzalez 09/12/2020, 12:58 PM

## 2020-09-12 NOTE — Progress Notes (Signed)
       Subjective: Patient feels a lot better after Toradol.  Difficulty telling whether all of her pain is secondary to her hemorrhoids vs from delivery and grade 2 tear.  She was able to reduce most of her hemorrhoids after a BM last night.  ROS: See above, otherwise other systems negative  Objective: Vital signs in last 24 hours: Temp:  [97.4 F (36.3 C)-98.3 F (36.8 C)] 97.9 F (36.6 C) (07/22 0524) Pulse Rate:  [77-84] 84 (07/22 0524) Resp:  [16-18] 18 (07/22 0524) BP: (100-117)/(64-78) 111/74 (07/22 0524) SpO2:  [97 %-99 %] 97 % (07/22 0524)    Intake/Output from previous day: No intake/output data recorded. Intake/Output this shift: No intake/output data recorded.  PE: Rectal: all hemorrhoids reduced except one which appears somewhat improved from yesterday.  This is still tender but nowhere near as tender as yesterday.  Lab Results:  Recent Labs    09/10/20 1530 09/11/20 0528  WBC 14.9* 20.3*  HGB 12.1 11.3*  HCT 37.2 34.2*  PLT 179 153   BMET No results for input(s): NA, K, CL, CO2, GLUCOSE, BUN, CREATININE, CALCIUM in the last 72 hours. PT/INR No results for input(s): LABPROT, INR in the last 72 hours. CMP  No results found for: NA, K, CL, CO2, GLUCOSE, BUN, CREATININE, CALCIUM, PROT, ALBUMIN, AST, ALT, ALKPHOS, BILITOT, GFRNONAA, GFRAA Lipase  No results found for: LIPASE     Studies/Results: No results found.  Anti-infectives: Anti-infectives (From admission, onward)    None        Assessment/Plan Hemorrhoids -patient would like to defer surgical intervention at this time. -she will continue Tylenol and ibuprofen for pain control as well as ice, sitz bathes, anusol cream, lidocaine cream, etc - will arrange for colorectal follow up and we have discussed that if her symptoms worsening acutely to call and we can see her in urgent office as well. -her and her husband would like to proceed with conservative management at this time which I  think is very reasonable as she is feeling better this morning. -stable for DC home from our standpoint.  FEN - regular diet VTE - per primary  ID - none   LOS: 2 days    Letha Cape , [facility] 09/12/2020, 8:08 AM Please see Amion for pager number during day hours 7:00am-4:30pm or 7:00am -11:30am on weekends

## 2020-09-12 NOTE — Discharge Summary (Signed)
Postpartum Discharge Summary      Patient Name: Chelsea Gonzalez DOB: 12-14-1985 MRN: [id number]  Date of admission: 09/10/2020 Delivery date:09/11/2020  Delivering provider: Huel Cote  Date of discharge: 09/12/2020  Admitting diagnosis: Indication for care in labor and delivery, antepartum [O75.9] Vacuum extractor delivery, delivered [O75.9] Intrauterine pregnancy: [redacted]w[redacted]d     Secondary diagnosis:  Active Problems:   Indication for care in labor and delivery, antepartum   Vacuum extractor delivery, delivered  Additional problems: hemorrhoids    Discharge diagnosis: Term Pregnancy Delivered                                               Hospital course: Induction of Labor With Vaginal Delivery   35 y.o. yo [id number] at [redacted]w[redacted]d was admitted to the hospital 09/10/2020 for induction of labor.  Indication for induction: Favorable cervix at term and Elective.  Patient had an uncomplicated labor course as follows: Membrane Rupture Time/Date: 5:21 PM ,09/10/2020   Delivery Method:Vaginal, Vacuum (Extractor)  Episiotomy: None  Lacerations:  2nd degree;Perineal  Details of delivery can be found in separate delivery note.  Patient had a routine postpartum course. Patient is discharged home 09/12/20.  Newborn Data: Birth date:09/11/2020  Birth time:12:57 AM  Gender:Female  Living status:Living  Apgars:9 ,9  Weight:3685 g   Physical exam  Vitals:   09/11/20 1205 09/11/20 1526 09/11/20 2127 09/12/20 0524  BP: 100/64 102/74 117/78 111/74  Pulse: 77 84 77 84  Resp: 18 16 17 18   Temp: 98.1 F (36.7 C) 98.3 F (36.8 C) (!) 97.4 F (36.3 C) 97.9 F (36.6 C)  TempSrc: Oral Oral Axillary Oral  SpO2: 99% 99% 98% 97%  Weight:      Height:       General: alert Lochia: appropriate Uterine Fundus: firm  Labs: Lab Results  Component Value Date   WBC 20.3 (H) 09/11/2020   HGB 11.3 (L) 09/11/2020   HCT 34.2 (L) 09/11/2020   MCV 91.9 09/11/2020   PLT 153 09/11/2020   No  flowsheet data found. Edinburgh Score: Edinburgh Postnatal Depression Scale Screening Tool 09/12/2020  I have been able to laugh and see the funny side of things. 1  I have looked forward with enjoyment to things. 0  I have blamed myself unnecessarily when things went wrong. 0  I have been anxious or worried for no good reason. 0  I have felt scared or panicky for no good reason. 0  Things have been getting on top of me. 2  I have been so unhappy that I have had difficulty sleeping. 0  I have felt sad or miserable. 1  I have been so unhappy that I have been crying. 1  The thought of harming myself has occurred to me. 0  Edinburgh Postnatal Depression Scale Total 5      After visit meds:  Allergies as of 09/12/2020   No Known Allergies      Medication List     STOP taking these medications    hydroxyprogesterone caproate 250 mg/mL Oil injection Commonly known as: MAKENA       TAKE these medications    acetaminophen 500 MG tablet Commonly known as: TYLENOL Take 2 tablets (1,000 mg total) by mouth every 6 (six) hours.   docusate sodium 100 MG capsule Commonly known as: COLACE Take 100 mg by mouth  2 (two) times daily.   hydrocortisone 2.5 % rectal cream Commonly known as: ANUSOL-HC Place rectally 3 (three) times daily.   ibuprofen 600 MG tablet Commonly known as: ADVIL Take 1 tablet (600 mg total) by mouth every 6 (six) hours.   ketorolac 10 MG tablet Commonly known as: TORADOL Take 1 tablet (10 mg total) by mouth every 6 (six) hours as needed for moderate pain.   levocetirizine 5 MG tablet Commonly known as: XYZAL Take 5 mg by mouth every evening.   lidocaine 4 % cream Commonly known as: LMX Apply topically 3 (three) times daily as needed (rectal pain).   oxyCODONE 5 MG immediate release tablet Commonly known as: Oxy IR/ROXICODONE Take 1 tablet (5 mg total) by mouth every 4 (four) hours as needed for severe pain.   PRENATAL VITAMINS PO Take by mouth.    VITAMIN D PO Take by mouth.         Discharge home in stable condition Infant Feeding: Bottle and Breast Infant Disposition:home with mother Discharge instruction: per After Visit Summary and Postpartum booklet. Activity: Advance as tolerated. Pelvic rest for 6 weeks.  Diet: routine diet Postpartum Appointment:6 weeks Follow up Visit:  Follow-up Information     [facility], [facility] Follow up in 3 week(s).   Specialty: General Surgery Why: we are working on scheduling your appointment Contact information: [address] [place] [address] [phone]         Huel Cote, MD. Schedule an appointment as soon as possible for a visit in 6 week(s).   Specialty: Obstetrics and Gynecology Contact information: [address] [place] [address] [phone]                     09/12/2020 Zenaida Niece, MD

## 2020-09-12 NOTE — Discharge Instructions (Signed)
Apply ice to affected area as often as able to help reduce swelling

## 2020-09-23 ENCOUNTER — Telehealth (HOSPITAL_COMMUNITY): Payer: Self-pay | Admitting: *Deleted

## 2020-09-23 NOTE — Telephone Encounter (Signed)
Patient voiced no questions or concerns for her own health at this time. EPDS = 1. Patient reported infant doing well. Reported bilirubin levels decreasing - followed by pediatrician. Peds appointment scheduled for tomorrow, per patient report. Patient reported exhaustion from pumping process, and verbalized concerns over infant's inability to latch and transfer milk effectively. RN provided patient with number for outpatient lactation office and emailed information on virtual breastfeeding and pumping support groups as well as the virtual Baby and Me class. No other questions or concerns voiced at this time. Deforest Hoyles, RN 09/23/20, [phone].

## 2020-10-30 DIAGNOSIS — Z3009 Encounter for other general counseling and advice on contraception: Secondary | ICD-10-CM | POA: Diagnosis not present

## 2020-10-30 DIAGNOSIS — K649 Unspecified hemorrhoids: Secondary | ICD-10-CM | POA: Diagnosis not present

## 2020-10-30 DIAGNOSIS — Z1389 Encounter for screening for other disorder: Secondary | ICD-10-CM | POA: Diagnosis not present
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
## 2022-02-05 DIAGNOSIS — Z124 Encounter for screening for malignant neoplasm of cervix: Secondary | ICD-10-CM | POA: Diagnosis not present

## 2022-02-05 DIAGNOSIS — Z01419 Encounter for gynecological examination (general) (routine) without abnormal findings: Secondary | ICD-10-CM | POA: Diagnosis not present

## 2022-02-05 DIAGNOSIS — Z1389 Encounter for screening for other disorder: Secondary | ICD-10-CM | POA: Diagnosis not present

## 2022-02-05 DIAGNOSIS — Z1151 Encounter for screening for human papillomavirus (HPV): Secondary | ICD-10-CM | POA: Diagnosis not present

## 2022-02-05 DIAGNOSIS — Z130 Encounter for screening for diseases of the blood and blood-forming organs and certain disorders involving the immune mechanism: Secondary | ICD-10-CM | POA: Diagnosis not present

## 2022-03-01 DIAGNOSIS — Z30430 Encounter for insertion of intrauterine contraceptive device: Secondary | ICD-10-CM | POA: Diagnosis not present

## 2022-03-15 DIAGNOSIS — F411 Generalized anxiety disorder: Secondary | ICD-10-CM | POA: Diagnosis not present

## 2022-03-29 DIAGNOSIS — F411 Generalized anxiety disorder: Secondary | ICD-10-CM | POA: Diagnosis not present

## 2022-04-05 DIAGNOSIS — F411 Generalized anxiety disorder: Secondary | ICD-10-CM | POA: Diagnosis not present

## 2022-04-26 DIAGNOSIS — F411 Generalized anxiety disorder: Secondary | ICD-10-CM | POA: Diagnosis not present

## 2022-05-10 DIAGNOSIS — F411 Generalized anxiety disorder: Secondary | ICD-10-CM | POA: Diagnosis not present

## 2022-05-31 ENCOUNTER — Other Ambulatory Visit (HOSPITAL_COMMUNITY): Payer: Self-pay

## 2022-05-31 DIAGNOSIS — F411 Generalized anxiety disorder: Secondary | ICD-10-CM | POA: Diagnosis not present

## 2022-05-31 MED ORDER — BUPROPION HCL ER (XL) 300 MG PO TB24
300.0000 mg | ORAL_TABLET | Freq: Every day | ORAL | 1 refills | Status: DC
  Filled 2022-05-31 (×2): qty 90, 90d supply, fill #0
  Filled 2022-09-01: qty 90, 90d supply, fill #1

## 2022-06-02 ENCOUNTER — Other Ambulatory Visit (HOSPITAL_COMMUNITY): Payer: Self-pay

## 2022-06-02 MED ORDER — ZEPBOUND 7.5 MG/0.5ML ~~LOC~~ SOAJ
7.5000 mg | SUBCUTANEOUS | 1 refills | Status: AC
  Filled 2022-06-02 – 2022-06-03 (×2): qty 2, 28d supply, fill #0

## 2022-06-03 ENCOUNTER — Other Ambulatory Visit (HOSPITAL_COMMUNITY): Payer: Self-pay

## 2022-06-03 ENCOUNTER — Other Ambulatory Visit: Payer: Self-pay

## 2022-06-07 DIAGNOSIS — F411 Generalized anxiety disorder: Secondary | ICD-10-CM | POA: Diagnosis not present

## 2022-06-08 ENCOUNTER — Other Ambulatory Visit (HOSPITAL_COMMUNITY): Payer: Self-pay

## 2022-06-14 DIAGNOSIS — F411 Generalized anxiety disorder: Secondary | ICD-10-CM | POA: Diagnosis not present

## 2022-06-21 DIAGNOSIS — F411 Generalized anxiety disorder: Secondary | ICD-10-CM | POA: Diagnosis not present

## 2022-06-24 ENCOUNTER — Other Ambulatory Visit (HOSPITAL_COMMUNITY): Payer: Self-pay

## 2022-06-24 MED ORDER — ZEPBOUND 10 MG/0.5ML ~~LOC~~ SOAJ
10.0000 mg | SUBCUTANEOUS | 0 refills | Status: AC
  Filled 2022-06-24 – 2022-07-08 (×3): qty 2, 28d supply, fill #0

## 2022-06-26 ENCOUNTER — Other Ambulatory Visit (HOSPITAL_COMMUNITY): Payer: Self-pay

## 2022-06-28 ENCOUNTER — Other Ambulatory Visit: Payer: Self-pay

## 2022-06-28 ENCOUNTER — Other Ambulatory Visit (HOSPITAL_COMMUNITY): Payer: Self-pay

## 2022-06-29 ENCOUNTER — Other Ambulatory Visit (HOSPITAL_COMMUNITY): Payer: Self-pay

## 2022-06-29 ENCOUNTER — Other Ambulatory Visit: Payer: Self-pay

## 2022-07-05 DIAGNOSIS — F411 Generalized anxiety disorder: Secondary | ICD-10-CM | POA: Diagnosis not present

## 2022-07-08 ENCOUNTER — Other Ambulatory Visit: Payer: Self-pay

## 2022-07-08 ENCOUNTER — Other Ambulatory Visit (HOSPITAL_COMMUNITY): Payer: Self-pay

## 2022-07-08 MED ORDER — ZEPBOUND 12.5 MG/0.5ML ~~LOC~~ SOAJ
12.5000 mg | SUBCUTANEOUS | 1 refills | Status: AC
  Filled 2022-07-29: qty 2, 28d supply, fill #0

## 2022-07-09 ENCOUNTER — Other Ambulatory Visit (HOSPITAL_COMMUNITY): Payer: Self-pay

## 2022-07-12 DIAGNOSIS — F411 Generalized anxiety disorder: Secondary | ICD-10-CM | POA: Diagnosis not present

## 2022-07-26 DIAGNOSIS — F411 Generalized anxiety disorder: Secondary | ICD-10-CM | POA: Diagnosis not present

## 2022-07-29 ENCOUNTER — Other Ambulatory Visit: Payer: Self-pay

## 2022-07-29 ENCOUNTER — Other Ambulatory Visit (HOSPITAL_COMMUNITY): Payer: Self-pay

## 2022-08-02 DIAGNOSIS — F411 Generalized anxiety disorder: Secondary | ICD-10-CM | POA: Diagnosis not present

## 2022-08-09 DIAGNOSIS — F411 Generalized anxiety disorder: Secondary | ICD-10-CM | POA: Diagnosis not present

## 2022-08-23 DIAGNOSIS — F411 Generalized anxiety disorder: Secondary | ICD-10-CM | POA: Diagnosis not present

## 2022-08-30 ENCOUNTER — Other Ambulatory Visit (HOSPITAL_COMMUNITY): Payer: Self-pay

## 2022-08-30 MED ORDER — ZEPBOUND 15 MG/0.5ML ~~LOC~~ SOAJ
15.0000 mg | SUBCUTANEOUS | 2 refills | Status: DC
  Filled 2022-08-30: qty 2, 28d supply, fill #0
  Filled 2022-09-15 – 2022-10-18 (×3): qty 2, 28d supply, fill #1
  Filled 2022-11-28: qty 2, 28d supply, fill #2

## 2022-08-31 ENCOUNTER — Other Ambulatory Visit: Payer: Self-pay

## 2022-08-31 ENCOUNTER — Other Ambulatory Visit (HOSPITAL_COMMUNITY): Payer: Self-pay

## 2022-09-01 ENCOUNTER — Other Ambulatory Visit (HOSPITAL_COMMUNITY): Payer: Self-pay

## 2022-09-06 DIAGNOSIS — F411 Generalized anxiety disorder: Secondary | ICD-10-CM | POA: Diagnosis not present

## 2022-09-14 ENCOUNTER — Other Ambulatory Visit (HOSPITAL_COMMUNITY): Payer: Self-pay

## 2022-09-14 ENCOUNTER — Other Ambulatory Visit: Payer: Self-pay

## 2022-09-14 MED ORDER — ZEPBOUND 12.5 MG/0.5ML ~~LOC~~ SOAJ
12.5000 mg | SUBCUTANEOUS | 0 refills | Status: AC
  Filled 2022-09-14: qty 2, 28d supply, fill #0

## 2022-09-15 ENCOUNTER — Other Ambulatory Visit (HOSPITAL_COMMUNITY): Payer: Self-pay

## 2022-09-20 DIAGNOSIS — F411 Generalized anxiety disorder: Secondary | ICD-10-CM | POA: Diagnosis not present

## 2022-09-27 DIAGNOSIS — F411 Generalized anxiety disorder: Secondary | ICD-10-CM | POA: Diagnosis not present

## 2022-10-18 ENCOUNTER — Other Ambulatory Visit (HOSPITAL_COMMUNITY): Payer: Self-pay

## 2022-10-18 DIAGNOSIS — F3341 Major depressive disorder, recurrent, in partial remission: Secondary | ICD-10-CM | POA: Diagnosis not present

## 2022-10-18 DIAGNOSIS — F411 Generalized anxiety disorder: Secondary | ICD-10-CM | POA: Diagnosis not present

## 2022-11-01 DIAGNOSIS — F411 Generalized anxiety disorder: Secondary | ICD-10-CM | POA: Diagnosis not present

## 2022-11-04 ENCOUNTER — Other Ambulatory Visit (HOSPITAL_COMMUNITY): Payer: Self-pay

## 2022-11-04 MED ORDER — NURTEC 75 MG PO TBDP
ORAL_TABLET | ORAL | 0 refills | Status: AC
  Filled 2022-11-04 – 2022-11-09 (×3): qty 9, 30d supply, fill #0

## 2022-11-05 ENCOUNTER — Other Ambulatory Visit (HOSPITAL_COMMUNITY): Payer: Self-pay

## 2022-11-05 ENCOUNTER — Other Ambulatory Visit (HOSPITAL_BASED_OUTPATIENT_CLINIC_OR_DEPARTMENT_OTHER): Payer: Self-pay

## 2022-11-05 MED ORDER — NURTEC 75 MG PO TBDP
75.0000 mg | ORAL_TABLET | Freq: Every day | ORAL | 0 refills | Status: AC
  Filled 2022-11-05 (×2): qty 8, 30d supply, fill #0
  Filled 2022-11-05 – 2022-11-09 (×2): qty 8, 8d supply, fill #0

## 2022-11-08 DIAGNOSIS — H5319 Other subjective visual disturbances: Secondary | ICD-10-CM | POA: Diagnosis not present

## 2022-11-08 DIAGNOSIS — G43101 Migraine with aura, not intractable, with status migrainosus: Secondary | ICD-10-CM | POA: Diagnosis not present

## 2022-11-08 DIAGNOSIS — F411 Generalized anxiety disorder: Secondary | ICD-10-CM | POA: Diagnosis not present

## 2022-11-08 DIAGNOSIS — H53413 Scotoma involving central area, bilateral: Secondary | ICD-10-CM | POA: Diagnosis not present

## 2022-11-09 ENCOUNTER — Other Ambulatory Visit (HOSPITAL_COMMUNITY): Payer: Self-pay

## 2022-11-09 ENCOUNTER — Other Ambulatory Visit: Payer: Self-pay

## 2022-11-15 DIAGNOSIS — F411 Generalized anxiety disorder: Secondary | ICD-10-CM | POA: Diagnosis not present

## 2022-11-22 DIAGNOSIS — F411 Generalized anxiety disorder: Secondary | ICD-10-CM | POA: Diagnosis not present

## 2022-11-28 ENCOUNTER — Other Ambulatory Visit (HOSPITAL_COMMUNITY): Payer: Self-pay

## 2022-11-29 ENCOUNTER — Other Ambulatory Visit (HOSPITAL_COMMUNITY): Payer: Self-pay

## 2022-11-29 ENCOUNTER — Other Ambulatory Visit: Payer: Self-pay

## 2022-11-29 DIAGNOSIS — F411 Generalized anxiety disorder: Secondary | ICD-10-CM | POA: Diagnosis not present

## 2022-11-29 MED ORDER — BUPROPION HCL ER (XL) 300 MG PO TB24
300.0000 mg | ORAL_TABLET | Freq: Every day | ORAL | 1 refills | Status: DC
  Filled 2022-11-29: qty 90, 90d supply, fill #0
  Filled 2023-03-03 (×2): qty 90, 90d supply, fill #1

## 2022-12-15 ENCOUNTER — Other Ambulatory Visit: Payer: Self-pay

## 2022-12-15 ENCOUNTER — Ambulatory Visit (INDEPENDENT_AMBULATORY_CARE_PROVIDER_SITE_OTHER): Payer: 59 | Admitting: Neurology

## 2022-12-15 ENCOUNTER — Encounter: Payer: Self-pay | Admitting: Neurology

## 2022-12-15 VITALS — BP 116/83 | HR 78 | Ht 69.0 in | Wt 166.0 lb

## 2022-12-15 DIAGNOSIS — G43E09 Chronic migraine with aura, not intractable, without status migrainosus: Secondary | ICD-10-CM | POA: Diagnosis not present

## 2022-12-15 DIAGNOSIS — F411 Generalized anxiety disorder: Secondary | ICD-10-CM | POA: Diagnosis not present

## 2022-12-15 MED ORDER — TOPIRAMATE 50 MG PO TABS
200.0000 mg | ORAL_TABLET | Freq: Two times a day (BID) | ORAL | 5 refills | Status: DC
  Filled 2022-12-15: qty 120, 15d supply, fill #0

## 2022-12-15 NOTE — Progress Notes (Signed)
Chief Complaint  Patient presents with   New Patient (Initial Visit)    Rm14, alone, NP Paper referral for Visual field deficits, hx of migraine: 10 years or longer w/aura that only occur 1-2 a year, tried imitrex didn't work, tried nurtec and it seemed to work but vision seemed weird like looking thru someone else's glasses. Also pt stated she sees a small spot in both eyes even if she closes one eye. Pt completed mri       ASSESSMENT AND PLAN  Chelsea Gonzalez is a 37 y.o. female   Migraine with visual aura  Normal MRI of the brain per patient from outside facility,  Normal ophthalmology evaluation,  The atypical feature is the persistent mild visual abnormality following her migraine headache more than a month ago,  Will try Topamax 50 mg may titrating to 200 mg every night as migraine prevention,  Nurtec as needed, may combine with Aleve  DIAGNOSTIC DATA (LABS, IMAGING, TESTING) - I reviewed patient records, labs, notes, testing and imaging myself where available.   MEDICAL HISTORY:  Chelsea Gonzalez is a 37 year old female, seen in request by her primary care doctor Orland Mustard, for persistent visual field deficit   History is obtained from the patient and review of electronic medical records. I personally reviewed pertinent available imaging films in PACS.   PMHx of  Obesity Depression, Chronic migraine  She works as Merchant navy officer, require precise vision, in September 2024, she suffered a migraine with some blurry vision, headache quickly improved with Nurtec, but woke up next morning, vision was still off, later was able to identify she has a small area in her visual field block her vision, it is present by closing either left or right eye, described worse was partially blurred if it falling into that abnormal visual area, which is relatively small, does not affecting her ability to visual large object,  Following that headache, for 1 to 2 weeks she suffered  frequent migraine, headaches respond well to Nurtec,  However, her visual complaint persistent till now, even though she no longer have a headache  She was seen by ophthalmologist, no abnormalities found, MRI of the brain with and without contrast at outside facility reported normal   She had long history of migraine headache, often preceding with visual aura, colorful zigzag patterns with dynamic change lasting for 30 minutes, but never persistent  PHYSICAL EXAM:   Vitals:   12/15/22 0853  BP: 116/83  Pulse: 78  Weight: 166 lb (75.3 kg)  Height: 5\' 9"  (1.753 m)      Body mass index is 24.51 kg/m.  PHYSICAL EXAMNIATION:  Gen: NAD, conversant, well nourised, well groomed                     Cardiovascular: Regular rate rhythm, no peripheral edema, warm, nontender. Eyes: Conjunctivae clear without exudates or hemorrhage Neck: Supple, no carotid bruits. Pulmonary: Clear to auscultation bilaterally   NEUROLOGICAL EXAM:  MENTAL STATUS: Speech/cognition: Awake, alert, oriented to history taking and casual conversation CRANIAL NERVES: CN II: Visual fields are full to confrontation. Pupils are round equal and briskly reactive to light.  Funduscopy examination was normal bilaterally CN III, IV, VI: extraocular movement are normal. No ptosis. CN V: Facial sensation is intact to light touch CN VII: Face is symmetric with normal eye closure  CN VIII: Hearing is normal to causal conversation. CN IX, X: Phonation is normal. CN XI: Head turning and shoulder shrug are intact  MOTOR: There is no pronator drift of out-stretched arms. Muscle bulk and tone are normal. Muscle strength is normal.  REFLEXES: Reflexes are 2+ and symmetric at the biceps, triceps, knees, and ankles. Plantar responses are flexor.  SENSORY: Intact to light touch, pinprick and vibratory sensation are intact in fingers and toes.  COORDINATION: There is no trunk or limb dysmetria  noted.  GAIT/STANCE: Posture is normal. Gait is steady with normal steps, base, arm swing, and turning. Heel and toe walking are normal. Tandem gait is normal.  Romberg is absent.  REVIEW OF SYSTEMS:  Full 14 system review of systems performed and notable only for as above All other review of systems were negative.   ALLERGIES: No Known Allergies  HOME MEDICATIONS: Current Outpatient Medications  Medication Sig Dispense Refill   acetaminophen (TYLENOL) 500 MG tablet Take 2 tablets (1,000 mg total) by mouth every 6 (six) hours. 30 tablet 0   buPROPion (WELLBUTRIN XL) 300 MG 24 hr tablet Take 1 tablet (300 mg total) by mouth daily. 90 tablet 1   docusate sodium (COLACE) 100 MG capsule Take 100 mg by mouth 2 (two) times daily.     ibuprofen (ADVIL) 600 MG tablet Take 1 tablet (600 mg total) by mouth every 6 (six) hours. 30 tablet 0   Prenatal Vit-Fe Fumarate-FA (PRENATAL VITAMINS PO) Take by mouth.     Rimegepant Sulfate (NURTEC) 75 MG TBDP Take 1 tablet by mouth once a day at onset of migraine 9 tablet 0   Rimegepant Sulfate (NURTEC) 75 MG TBDP Take 1 tablet (75 mg total) by mouth daily at onset of migraine. 8 tablet 0   tirzepatide (ZEPBOUND) 10 MG/0.5ML Pen Inject 10 mg into the skin every 7 (seven) days. 2 mL 0   tirzepatide (ZEPBOUND) 12.5 MG/0.5ML Pen Inject 12.5 mg into the skin every 7 (seven) days. 2 mL 1   tirzepatide (ZEPBOUND) 12.5 MG/0.5ML Pen Inject 12.5 mg into the skin once a week. 2 mL 0   tirzepatide (ZEPBOUND) 15 MG/0.5ML Pen Inject 15 mg into the skin once a week. 2 mL 2   tirzepatide (ZEPBOUND) 7.5 MG/0.5ML Pen Inject 7.5 mg into the skin every 7 (seven) days. 2 mL 1   VITAMIN D PO Take by mouth.     No current facility-administered medications for this visit.    PAST MEDICAL HISTORY: Past Medical History:  Diagnosis Date   Anxiety    Headache    migraine w/aura occas    PAST SURGICAL HISTORY: Past Surgical History:  Procedure Laterality Date   CLAVICLE  SURGERY     WISDOM TOOTH EXTRACTION      FAMILY HISTORY: Family History  Problem Relation Age of Onset   Hypertension Mother    Diabetes Mother    Hypothyroidism Mother    Cancer Mother    Hypertension Father    Stroke Father     SOCIAL HISTORY: Social History   Socioeconomic History   Marital status: Married    Spouse name: Not on file   Number of children: 2   Years of education: Not on file   Highest education level: Professional school degree (e.g., MD, DDS, DVM, JD)  Occupational History   Not on file  Tobacco Use   Smoking status: Never   Smokeless tobacco: Never  Vaping Use   Vaping status: Never Used  Substance and Sexual Activity   Alcohol use: Yes    Alcohol/week: 10.0 standard drinks of alcohol    Types: 10 Standard drinks  or equivalent per week   Drug use: Never   Sexual activity: Yes    Birth control/protection: None  Other Topics Concern   Not on file  Social History Narrative   Not on file   Social Determinants of Health   Financial Resource Strain: Not on file  Food Insecurity: Not on file  Transportation Needs: Not on file  Physical Activity: Not on file  Stress: Not on file  Social Connections: Not on file  Intimate Partner Violence: Not on file      Levert Feinstein, M.D. Ph.D.  [facility] [address] [place], [place] [address] Ph: [phone] Fax: [phone]  CC:  Orland Mustard, MD [address] [place],  [place] [address]  Pcp, No

## 2022-12-22 DIAGNOSIS — F411 Generalized anxiety disorder: Secondary | ICD-10-CM | POA: Diagnosis not present

## 2022-12-29 DIAGNOSIS — F411 Generalized anxiety disorder: Secondary | ICD-10-CM | POA: Diagnosis not present

## 2023-01-02 ENCOUNTER — Other Ambulatory Visit (HOSPITAL_COMMUNITY): Payer: Self-pay

## 2023-01-03 ENCOUNTER — Other Ambulatory Visit (HOSPITAL_COMMUNITY): Payer: Self-pay

## 2023-01-03 MED ORDER — TIRZEPATIDE-WEIGHT MANAGEMENT 15 MG/0.5ML ~~LOC~~ SOAJ
15.0000 mg | SUBCUTANEOUS | 2 refills | Status: DC
  Filled 2023-01-03 – 2023-01-06 (×2): qty 2, 28d supply, fill #0
  Filled 2023-01-26 – 2023-01-31 (×3): qty 2, 28d supply, fill #1
  Filled 2023-02-28: qty 2, 28d supply, fill #2

## 2023-01-05 DIAGNOSIS — F411 Generalized anxiety disorder: Secondary | ICD-10-CM | POA: Diagnosis not present

## 2023-01-06 ENCOUNTER — Other Ambulatory Visit (HOSPITAL_COMMUNITY): Payer: Self-pay

## 2023-01-06 ENCOUNTER — Other Ambulatory Visit: Payer: Self-pay

## 2023-01-06 MED ORDER — BENZONATATE 100 MG PO CAPS
100.0000 mg | ORAL_CAPSULE | Freq: Three times a day (TID) | ORAL | 0 refills | Status: AC | PRN
  Filled 2023-01-06: qty 30, 10d supply, fill #0

## 2023-01-06 MED ORDER — AZITHROMYCIN 250 MG PO TABS
ORAL_TABLET | ORAL | 0 refills | Status: AC
  Filled 2023-01-06: qty 6, 5d supply, fill #0

## 2023-01-12 DIAGNOSIS — F411 Generalized anxiety disorder: Secondary | ICD-10-CM | POA: Diagnosis not present

## 2023-01-25 ENCOUNTER — Other Ambulatory Visit (HOSPITAL_COMMUNITY): Payer: Self-pay

## 2023-01-26 DIAGNOSIS — F411 Generalized anxiety disorder: Secondary | ICD-10-CM | POA: Diagnosis not present

## 2023-01-31 ENCOUNTER — Other Ambulatory Visit (HOSPITAL_COMMUNITY): Payer: Self-pay

## 2023-02-01 ENCOUNTER — Other Ambulatory Visit (HOSPITAL_COMMUNITY): Payer: Self-pay

## 2023-02-01 ENCOUNTER — Other Ambulatory Visit: Payer: Self-pay

## 2023-02-02 DIAGNOSIS — F411 Generalized anxiety disorder: Secondary | ICD-10-CM | POA: Diagnosis not present

## 2023-02-09 DIAGNOSIS — H53419 Scotoma involving central area, unspecified eye: Secondary | ICD-10-CM | POA: Diagnosis not present

## 2023-02-09 DIAGNOSIS — F411 Generalized anxiety disorder: Secondary | ICD-10-CM | POA: Diagnosis not present

## 2023-02-28 ENCOUNTER — Other Ambulatory Visit (HOSPITAL_COMMUNITY): Payer: Self-pay

## 2023-02-28 ENCOUNTER — Other Ambulatory Visit: Payer: Self-pay

## 2023-03-03 ENCOUNTER — Other Ambulatory Visit (HOSPITAL_COMMUNITY): Payer: Self-pay

## 2023-03-04 ENCOUNTER — Other Ambulatory Visit (HOSPITAL_COMMUNITY): Payer: Self-pay

## 2023-03-09 DIAGNOSIS — F411 Generalized anxiety disorder: Secondary | ICD-10-CM | POA: Diagnosis not present

## 2023-03-09 DIAGNOSIS — Z01411 Encounter for gynecological examination (general) (routine) with abnormal findings: Secondary | ICD-10-CM | POA: Diagnosis not present

## 2023-03-09 DIAGNOSIS — T8389XA Other specified complication of genitourinary prosthetic devices, implants and grafts, initial encounter: Secondary | ICD-10-CM | POA: Diagnosis not present

## 2023-03-09 DIAGNOSIS — Z01419 Encounter for gynecological examination (general) (routine) without abnormal findings: Secondary | ICD-10-CM | POA: Diagnosis not present

## 2023-03-09 DIAGNOSIS — Z1389 Encounter for screening for other disorder: Secondary | ICD-10-CM | POA: Diagnosis not present

## 2023-03-09 DIAGNOSIS — Z130 Encounter for screening for diseases of the blood and blood-forming organs and certain disorders involving the immune mechanism: Secondary | ICD-10-CM | POA: Diagnosis not present

## 2023-03-15 ENCOUNTER — Encounter: Payer: Self-pay | Admitting: Gastroenterology

## 2023-03-16 DIAGNOSIS — F411 Generalized anxiety disorder: Secondary | ICD-10-CM | POA: Diagnosis not present

## 2023-03-23 DIAGNOSIS — F411 Generalized anxiety disorder: Secondary | ICD-10-CM | POA: Diagnosis not present

## 2023-03-30 DIAGNOSIS — F411 Generalized anxiety disorder: Secondary | ICD-10-CM | POA: Diagnosis not present

## 2023-04-01 ENCOUNTER — Other Ambulatory Visit (HOSPITAL_COMMUNITY): Payer: Self-pay

## 2023-04-01 MED ORDER — ZEPBOUND 15 MG/0.5ML ~~LOC~~ SOAJ
15.0000 mg | SUBCUTANEOUS | 2 refills | Status: DC
  Filled 2023-04-01: qty 2, 28d supply, fill #0
  Filled 2023-04-28: qty 2, 28d supply, fill #1
  Filled 2023-05-27: qty 2, 28d supply, fill #2

## 2023-04-02 ENCOUNTER — Other Ambulatory Visit (HOSPITAL_COMMUNITY): Payer: Self-pay

## 2023-04-06 DIAGNOSIS — Z30431 Encounter for routine checking of intrauterine contraceptive device: Secondary | ICD-10-CM | POA: Diagnosis not present

## 2023-04-06 DIAGNOSIS — Z1231 Encounter for screening mammogram for malignant neoplasm of breast: Secondary | ICD-10-CM | POA: Diagnosis not present

## 2023-04-06 DIAGNOSIS — F411 Generalized anxiety disorder: Secondary | ICD-10-CM | POA: Diagnosis not present

## 2023-04-15 ENCOUNTER — Other Ambulatory Visit: Payer: Self-pay | Admitting: Obstetrics and Gynecology

## 2023-04-15 DIAGNOSIS — R928 Other abnormal and inconclusive findings on diagnostic imaging of breast: Secondary | ICD-10-CM

## 2023-04-20 DIAGNOSIS — F411 Generalized anxiety disorder: Secondary | ICD-10-CM | POA: Diagnosis not present

## 2023-04-27 ENCOUNTER — Ambulatory Visit
Admission: RE | Admit: 2023-04-27 | Discharge: 2023-04-27 | Disposition: A | Discharge status: Home / Self Care | Payer: 59 | Source: Ambulatory Visit | Attending: Obstetrics and Gynecology | Admitting: Obstetrics and Gynecology

## 2023-04-27 DIAGNOSIS — T8339XD Other mechanical complication of intrauterine contraceptive device, subsequent encounter: Secondary | ICD-10-CM | POA: Diagnosis not present

## 2023-04-27 DIAGNOSIS — N6311 Unspecified lump in the right breast, upper outer quadrant: Secondary | ICD-10-CM | POA: Diagnosis not present

## 2023-04-27 DIAGNOSIS — R928 Other abnormal and inconclusive findings on diagnostic imaging of breast: Secondary | ICD-10-CM

## 2023-04-27 DIAGNOSIS — Z30430 Encounter for insertion of intrauterine contraceptive device: Secondary | ICD-10-CM | POA: Diagnosis not present

## 2023-04-27 DIAGNOSIS — R590 Localized enlarged lymph nodes: Secondary | ICD-10-CM | POA: Diagnosis not present

## 2023-04-27 DIAGNOSIS — Z30433 Encounter for removal and reinsertion of intrauterine contraceptive device: Secondary | ICD-10-CM | POA: Diagnosis not present

## 2023-04-27 DIAGNOSIS — F411 Generalized anxiety disorder: Secondary | ICD-10-CM | POA: Diagnosis not present

## 2023-04-28 ENCOUNTER — Other Ambulatory Visit (HOSPITAL_COMMUNITY): Payer: Self-pay

## 2023-05-04 ENCOUNTER — Ambulatory Visit: Payer: Self-pay | Admitting: General Surgery

## 2023-05-04 DIAGNOSIS — K644 Residual hemorrhoidal skin tags: Secondary | ICD-10-CM | POA: Diagnosis not present

## 2023-05-04 DIAGNOSIS — F411 Generalized anxiety disorder: Secondary | ICD-10-CM | POA: Diagnosis not present

## 2023-05-04 NOTE — H&P (Signed)
 REFERRING PHYSICIAN:  Oliver Pila, MD   PROVIDER:  Elenora Gamma, MD   MRN: [id number] DOB: 01/02/1986 DATE OF ENCOUNTER: 05/04/2023   Subjective    Chief Complaint: New Consultation (Hems)       History of Present Illness: Chelsea Gonzalez is a 38 y.o. female who is seen today as an office consultation at the request of Dr. Senaida Ores for evaluation of New Consultation (Hems) .   Patient is difficulty with hemorrhoidal issues for the past 6 years since the birth of her child.  She has a bowel regimen of Colace and MiraLAX that she uses to have regular bowel movements but notes that she gets significant inflammation if she deviates from this regimen at all.  She describes episodes of pain without bleeding.     Review of Systems: A complete review of systems was obtained from the patient.  I have reviewed this information and discussed as appropriate with the patient.  See HPI as well for other ROS.     Medical History: Past Medical History      Past Medical History:  Diagnosis Date   Anxiety           Problem List  There is no problem list on file for this patient.      Past Surgical History       Past Surgical History:  Procedure Laterality Date   SHOULDER ARTHROSCOPY DISTAL CLAVICLE EXCISION AND OPEN ROTATOR CUFF REPAIR   2004        Allergies  No Known Allergies     Medications Ordered Prior to Encounter        Current Outpatient Medications on File Prior to Visit  Medication Sig Dispense Refill   buPROPion (WELLBUTRIN XL) 300 MG XL tablet Take 1 tablet by mouth once daily       cholecalciferol (VITAMIN D3) 2,000 unit tablet Take 2,000 Units by mouth once daily       docusate (COLACE) 100 MG capsule Take 100 mg by mouth 2 (two) times daily       famotidine (PEPCID) 20 MG tablet Take 20 mg by mouth 2 (two) times daily       NURTEC ODT 75 mg disintegrating tablet Take 75 mg by mouth       polyethylene glycol (MIRALAX) packet Take 17 g  by mouth once daily Mix in 4-8ounces of fluid prior to taking.       topiramate (TOPAMAX) 50 MG tablet Take 200 mg by mouth       ZEPBOUND 10 mg/0.5 mL pen injector Inject 10 mg subcutaneously        No current facility-administered medications on file prior to visit.        Family History       Family History  Problem Relation Age of Onset   Obesity Mother     High blood pressure (Hypertension) Mother     Hyperlipidemia (Elevated cholesterol) Mother     Diabetes Mother     Stroke Father     Skin cancer Father     Obesity Father     High blood pressure (Hypertension) Father     Hyperlipidemia (Elevated cholesterol) Father          Tobacco Use History  Social History       Tobacco Use  Smoking Status Never  Smokeless Tobacco Never        Social History  Social History        Socioeconomic History  Marital status: Married  Tobacco Use   Smoking status: Never   Smokeless tobacco: Never  Vaping Use   Vaping status: Never Used  Substance and Sexual Activity   Alcohol use: Yes   Drug use: Never    Social Drivers of Health        Housing Stability: Unknown (05/04/2023)    Housing Stability Vital Sign     Homeless in the Last Year: No        Objective:         Vitals:    05/04/23 1051  BP: 112/77  Pulse: 91  Temp: 36.7 C (98 F)  SpO2: 98%  Weight: 75 kg (165 lb 6.4 oz)  Height: 175.3 cm (5\' 9" )  PainSc: 1       Exam Gen: NAD Abd: soft Rectal: good tone, anterior tag and R posterior skin tag     Labs, Imaging and Diagnostic Testing:   Procedure: Anoscopy Surgeon: Maisie Fus After the risks and benefits were explained, written consent was obtained for above procedure.  A medical assistant chaperone was present thoroughout the entire procedure.  Anesthesia: none Diagnosis: hemorrhoids Findings: grade 1 R posterior internal hemorrhoid    Patient was placed on the toilet to evaluate this episode of prolapse.  She was noted to have a  significantly swollen external hemorrhoid   Assessment and Plan:  Diagnoses and all orders for this visit:   Hemorrhoids, external Patient has a large RP external hemorrhoid that swells significantly with straining.  We discussed excision today in detail.  We discussed the risk of postoperative pain, bleeding and recurrence.  All questions were answered.  Given that this is obstructing her daily activities, she would like to proceed with hemorrhoidectomy.           Vanita Panda, MD Colon and Rectal Surgery [facility]

## 2023-05-08 ENCOUNTER — Other Ambulatory Visit (HOSPITAL_COMMUNITY): Payer: Self-pay

## 2023-05-08 MED ORDER — ONDANSETRON 4 MG PO TBDP
4.0000 mg | ORAL_TABLET | Freq: Four times a day (QID) | ORAL | 0 refills | Status: AC | PRN
  Filled 2023-05-08: qty 30, 8d supply, fill #0

## 2023-05-09 ENCOUNTER — Other Ambulatory Visit: Payer: Self-pay

## 2023-05-18 DIAGNOSIS — F411 Generalized anxiety disorder: Secondary | ICD-10-CM | POA: Diagnosis not present

## 2023-05-24 ENCOUNTER — Ambulatory Visit: Payer: 59 | Admitting: Gastroenterology

## 2023-05-25 DIAGNOSIS — F411 Generalized anxiety disorder: Secondary | ICD-10-CM | POA: Diagnosis not present

## 2023-05-26 ENCOUNTER — Other Ambulatory Visit (HOSPITAL_COMMUNITY): Payer: Self-pay

## 2023-05-27 ENCOUNTER — Other Ambulatory Visit (HOSPITAL_BASED_OUTPATIENT_CLINIC_OR_DEPARTMENT_OTHER): Payer: Self-pay

## 2023-05-27 ENCOUNTER — Other Ambulatory Visit (HOSPITAL_COMMUNITY): Payer: Self-pay

## 2023-05-27 ENCOUNTER — Other Ambulatory Visit: Payer: Self-pay

## 2023-05-27 MED ORDER — BUPROPION HCL ER (XL) 300 MG PO TB24
300.0000 mg | ORAL_TABLET | Freq: Every day | ORAL | 1 refills | Status: DC
  Filled 2023-05-27: qty 90, 90d supply, fill #0
  Filled 2023-08-31: qty 90, 90d supply, fill #1

## 2023-05-30 ENCOUNTER — Other Ambulatory Visit (HOSPITAL_COMMUNITY): Payer: Self-pay

## 2023-06-02 ENCOUNTER — Other Ambulatory Visit: Payer: Self-pay

## 2023-06-02 ENCOUNTER — Other Ambulatory Visit (HOSPITAL_COMMUNITY): Payer: Self-pay

## 2023-06-02 MED ORDER — DOXYCYCLINE MONOHYDRATE 100 MG PO TABS
100.0000 mg | ORAL_TABLET | Freq: Two times a day (BID) | ORAL | 0 refills | Status: AC
  Filled 2023-06-02: qty 28, 14d supply, fill #0

## 2023-06-08 ENCOUNTER — Other Ambulatory Visit: Payer: Self-pay

## 2023-06-08 ENCOUNTER — Encounter (HOSPITAL_BASED_OUTPATIENT_CLINIC_OR_DEPARTMENT_OTHER): Payer: Self-pay | Admitting: General Surgery

## 2023-06-15 DIAGNOSIS — F411 Generalized anxiety disorder: Secondary | ICD-10-CM | POA: Diagnosis not present

## 2023-06-16 ENCOUNTER — Encounter (HOSPITAL_BASED_OUTPATIENT_CLINIC_OR_DEPARTMENT_OTHER): Payer: Self-pay | Admitting: General Surgery

## 2023-06-16 ENCOUNTER — Other Ambulatory Visit: Payer: Self-pay

## 2023-06-16 ENCOUNTER — Encounter (HOSPITAL_BASED_OUTPATIENT_CLINIC_OR_DEPARTMENT_OTHER)
Admission: RE | Disposition: A | Discharge status: Home / Self Care | Payer: Self-pay | Source: Home / Self Care | Attending: General Surgery

## 2023-06-16 ENCOUNTER — Ambulatory Visit (HOSPITAL_BASED_OUTPATIENT_CLINIC_OR_DEPARTMENT_OTHER): Payer: Self-pay | Admitting: Anesthesiology

## 2023-06-16 ENCOUNTER — Ambulatory Visit (HOSPITAL_BASED_OUTPATIENT_CLINIC_OR_DEPARTMENT_OTHER)
Admission: RE | Admit: 2023-06-16 | Discharge: 2023-06-16 | Disposition: A | Discharge status: Home / Self Care | Attending: General Surgery | Admitting: General Surgery

## 2023-06-16 DIAGNOSIS — K644 Residual hemorrhoidal skin tags: Secondary | ICD-10-CM | POA: Diagnosis not present

## 2023-06-16 DIAGNOSIS — K648 Other hemorrhoids: Secondary | ICD-10-CM | POA: Insufficient documentation

## 2023-06-16 DIAGNOSIS — F418 Other specified anxiety disorders: Secondary | ICD-10-CM | POA: Insufficient documentation

## 2023-06-16 DIAGNOSIS — Z01818 Encounter for other preprocedural examination: Secondary | ICD-10-CM

## 2023-06-16 HISTORY — DX: Depression, unspecified: F32.A

## 2023-06-16 HISTORY — PX: HEMORRHOID SURGERY: SHX153

## 2023-06-16 HISTORY — DX: Residual hemorrhoidal skin tags: K64.4

## 2023-06-16 LAB — POCT PREGNANCY, URINE: Preg Test, Ur: NEGATIVE

## 2023-06-16 SURGERY — HEMORRHOIDECTOMY
Anesthesia: Monitor Anesthesia Care | Site: Rectum

## 2023-06-16 MED ORDER — MIDAZOLAM HCL 2 MG/2ML IJ SOLN
INTRAMUSCULAR | Status: AC
  Filled 2023-06-16: qty 2

## 2023-06-16 MED ORDER — ACETAMINOPHEN 10 MG/ML IV SOLN: 1000.0000 mg | Freq: Once | INTRAVENOUS | Status: DC | PRN

## 2023-06-16 MED ORDER — OXYCODONE HCL 5 MG PO TABS: 5.0000 mg | ORAL_TABLET | Freq: Once | ORAL | Status: DC | PRN

## 2023-06-16 MED ORDER — FENTANYL CITRATE (PF) 100 MCG/2ML IJ SOLN
INTRAMUSCULAR | Status: DC | PRN
Start: 2023-06-16 — End: 2023-06-16
  Administered 2023-06-16 (×2): 25 ug via INTRAVENOUS

## 2023-06-16 MED ORDER — GABAPENTIN 300 MG PO CAPS
300.0000 mg | ORAL_CAPSULE | ORAL | Status: AC
Start: 2023-06-16 — End: 2023-06-16
  Administered 2023-06-16: 300 mg via ORAL

## 2023-06-16 MED ORDER — BUPIVACAINE-EPINEPHRINE (PF) 0.5% -1:200000 IJ SOLN
INTRAMUSCULAR | Status: DC | PRN
  Administered 2023-06-16: 50 mL

## 2023-06-16 MED ORDER — LIDOCAINE HCL (CARDIAC) PF 100 MG/5ML IV SOSY
PREFILLED_SYRINGE | INTRAVENOUS | Status: DC | PRN
  Administered 2023-06-16: 40 mg via INTRAVENOUS

## 2023-06-16 MED ORDER — LACTATED RINGERS IV SOLN: INTRAVENOUS | Status: DC

## 2023-06-16 MED ORDER — LIDOCAINE 2% (20 MG/ML) 5 ML SYRINGE
INTRAMUSCULAR | Status: AC
  Filled 2023-06-16: qty 15

## 2023-06-16 MED ORDER — ACETAMINOPHEN 500 MG PO TABS
ORAL_TABLET | ORAL | Status: AC
  Filled 2023-06-16: qty 2

## 2023-06-16 MED ORDER — MIDAZOLAM HCL 2 MG/2ML IJ SOLN
INTRAMUSCULAR | Status: DC | PRN
  Administered 2023-06-16: 2 mg via INTRAVENOUS

## 2023-06-16 MED ORDER — OXYCODONE HCL 5 MG/5ML PO SOLN: 5.0000 mg | Freq: Once | ORAL | Status: DC | PRN

## 2023-06-16 MED ORDER — PROPOFOL 500 MG/50ML IV EMUL
INTRAVENOUS | Status: DC | PRN
  Administered 2023-06-16: 150 ug/kg/min via INTRAVENOUS

## 2023-06-16 MED ORDER — CELECOXIB 200 MG PO CAPS
ORAL_CAPSULE | ORAL | Status: AC
  Filled 2023-06-16: qty 1

## 2023-06-16 MED ORDER — ONDANSETRON HCL 4 MG/2ML IJ SOLN
INTRAMUSCULAR | Status: DC | PRN
  Administered 2023-06-16: 4 mg via INTRAVENOUS

## 2023-06-16 MED ORDER — STERILE WATER FOR IRRIGATION IR SOLN
Status: DC | PRN
  Administered 2023-06-16: 1000 mL

## 2023-06-16 MED ORDER — LIDOCAINE-PRILOCAINE 2.5-2.5 % EX CREA: 1.0000 | TOPICAL_CREAM | CUTANEOUS | 0 refills | Status: AC | PRN

## 2023-06-16 MED ORDER — ACETAMINOPHEN 500 MG PO TABS
1000.0000 mg | ORAL_TABLET | ORAL | Status: AC
  Administered 2023-06-16: 1000 mg via ORAL

## 2023-06-16 MED ORDER — KETOROLAC TROMETHAMINE 10 MG PO TABS: 10.0000 mg | ORAL_TABLET | Freq: Four times a day (QID) | ORAL | 0 refills | Status: AC | PRN

## 2023-06-16 MED ORDER — FENTANYL CITRATE (PF) 100 MCG/2ML IJ SOLN
INTRAMUSCULAR | Status: AC
  Filled 2023-06-16: qty 2

## 2023-06-16 MED ORDER — DEXAMETHASONE SODIUM PHOSPHATE 10 MG/ML IJ SOLN
INTRAMUSCULAR | Status: DC | PRN
  Administered 2023-06-16: 10 mg via INTRAVENOUS

## 2023-06-16 MED ORDER — KETOROLAC TROMETHAMINE 30 MG/ML IJ SOLN: 30.0000 mg | Freq: Four times a day (QID) | INTRAMUSCULAR | Status: DC | PRN

## 2023-06-16 MED ORDER — ONDANSETRON HCL 4 MG/2ML IJ SOLN
INTRAMUSCULAR | Status: AC
  Filled 2023-06-16: qty 4

## 2023-06-16 MED ORDER — GABAPENTIN 300 MG PO CAPS
ORAL_CAPSULE | ORAL | Status: AC
  Filled 2023-06-16: qty 1

## 2023-06-16 MED ORDER — BUPIVACAINE LIPOSOME 1.3 % IJ SUSP: 20.0000 mL | Freq: Once | INTRAMUSCULAR | Status: DC

## 2023-06-16 MED ORDER — DROPERIDOL 2.5 MG/ML IJ SOLN: 0.6250 mg | Freq: Once | INTRAMUSCULAR | Status: DC | PRN

## 2023-06-16 MED ORDER — SODIUM CHLORIDE 0.9% FLUSH: 3.0000 mL | Freq: Two times a day (BID) | INTRAVENOUS | Status: DC

## 2023-06-16 MED ORDER — CELECOXIB 200 MG PO CAPS
200.0000 mg | ORAL_CAPSULE | ORAL | Status: AC
  Administered 2023-06-16: 200 mg via ORAL

## 2023-06-16 MED ORDER — PROPOFOL 10 MG/ML IV BOLUS
INTRAVENOUS | Status: DC | PRN
  Administered 2023-06-16 (×2): 10 mg via INTRAVENOUS

## 2023-06-16 MED ORDER — DEXMEDETOMIDINE HCL IN NACL 80 MCG/20ML IV SOLN
INTRAVENOUS | Status: DC | PRN
  Administered 2023-06-16: 8 ug via INTRAVENOUS

## 2023-06-16 MED ORDER — TRAMADOL HCL 50 MG PO TABS: 50.0000 mg | ORAL_TABLET | Freq: Four times a day (QID) | ORAL | 0 refills | Status: AC | PRN

## 2023-06-16 MED ORDER — ROCURONIUM BROMIDE 10 MG/ML (PF) SYRINGE
PREFILLED_SYRINGE | INTRAVENOUS | Status: AC
  Filled 2023-06-16: qty 10

## 2023-06-16 MED ORDER — DEXAMETHASONE SODIUM PHOSPHATE 10 MG/ML IJ SOLN
INTRAMUSCULAR | Status: AC
  Filled 2023-06-16: qty 2

## 2023-06-16 MED ORDER — FENTANYL CITRATE (PF) 100 MCG/2ML IJ SOLN: 25.0000 ug | INTRAMUSCULAR | Status: DC | PRN

## 2023-06-16 SURGICAL SUPPLY — 30 items
BLADE SURG 10 STRL SS (BLADE) IMPLANT
BRIEF MESH DISP 2XL (UNDERPADS AND DIAPERS) IMPLANT
COVER BACK TABLE 60X90IN (DRAPES) ×1 IMPLANT
COVER MAYO STAND STRL (DRAPES) ×1 IMPLANT
DRAPE LAPAROTOMY 100X72 PEDS (DRAPES) ×1 IMPLANT
DRAPE UTILITY XL STRL (DRAPES) ×1 IMPLANT
ELECTRODE REM PT RTRN 9FT ADLT (ELECTROSURGICAL) ×1 IMPLANT
GAUZE PAD ABD 8X10 STRL (GAUZE/BANDAGES/DRESSINGS) ×1 IMPLANT
GAUZE SPONGE 4X4 12PLY STRL LF (GAUZE/BANDAGES/DRESSINGS) IMPLANT
GLOVE BIO SURGEON STRL SZ 6.5 (GLOVE) ×1 IMPLANT
GLOVE BIOGEL PI IND STRL 7.0 (GLOVE) IMPLANT
GLOVE INDICATOR 6.5 STRL GRN (GLOVE) ×1 IMPLANT
GLOVE SURG SS PI 6.5 STRL IVOR (GLOVE) IMPLANT
GLOVE SURG SS PI 7.0 STRL IVOR (GLOVE) IMPLANT
GOWN STRL REUS W/ TWL LRG LVL3 (GOWN DISPOSABLE) IMPLANT
NDL HYPO 22X1.5 SAFETY MO (MISCELLANEOUS) ×1 IMPLANT
NEEDLE HYPO 22X1.5 SAFETY MO (MISCELLANEOUS) ×1 IMPLANT
NS IRRIG 1000ML POUR BTL (IV SOLUTION) ×1 IMPLANT
PACK BASIN DAY SURGERY FS (CUSTOM PROCEDURE TRAY) ×1 IMPLANT
PENCIL SMOKE EVACUATOR (MISCELLANEOUS) ×1 IMPLANT
SLEEVE SCD COMPRESS KNEE MED (STOCKING) ×1 IMPLANT
SOL PREP POV-IOD 4OZ 10% (MISCELLANEOUS) IMPLANT
SUT CHROMIC 2 0 SH (SUTURE) ×1 IMPLANT
SUT CHROMIC 3 0 SH 27 (SUTURE) ×1 IMPLANT
SYR CONTROL 10ML LL (SYRINGE) ×1 IMPLANT
TOWEL GREEN STERILE FF (TOWEL DISPOSABLE) ×1 IMPLANT
TRAY DSU PREP LF (CUSTOM PROCEDURE TRAY) ×1 IMPLANT
TUBE CONNECTING 20X1/4 (TUBING) ×1 IMPLANT
WATER STERILE IRR 1000ML POUR (IV SOLUTION) IMPLANT
YANKAUER SUCT BULB TIP NO VENT (SUCTIONS) ×1 IMPLANT

## 2023-06-16 NOTE — H&P (Signed)
 REFERRING PHYSICIAN:  Adrianna Albee, MD   PROVIDER:  Denese Finn, MD   MRN: [id number] DOB: [birth year]    Subjective    Chief Complaint: New Consultation (Hems)       History of Present Illness: Chelsea Gonzalez is a 38 y.o. female who is seen today as an office consultation at the request of Dr. Wayna Hails for evaluation of New Consultation (Hems) .   Patient is difficulty with hemorrhoidal issues for the past 6 years since the birth of her child.  She has a bowel regimen of Colace and MiraLAX  that she uses to have regular bowel movements but notes that she gets significant inflammation if she deviates from this regimen at all.  She describes episodes of pain without bleeding.     Review of Systems: A complete review of systems was obtained from the patient.  I have reviewed this information and discussed as appropriate with the patient.  See HPI as well for other ROS.     Medical History: Past Medical History         Past Medical History:  Diagnosis Date   Anxiety            Problem List  There is no problem list on file for this patient.      Past Surgical History           Past Surgical History:  Procedure Laterality Date   SHOULDER ARTHROSCOPY DISTAL CLAVICLE EXCISION AND OPEN ROTATOR CUFF REPAIR   2004        Allergies  No Known Allergies      Medications Ordered Prior to Encounter             Current Outpatient Medications on File Prior to Visit  Medication Sig Dispense Refill   buPROPion  (WELLBUTRIN  XL) 300 MG XL tablet Take 1 tablet by mouth once daily       cholecalciferol (VITAMIN D3) 2,000 unit tablet Take 2,000 Units by mouth once daily       docusate (COLACE) 100 MG capsule Take 100 mg by mouth 2 (two) times daily       famotidine (PEPCID) 20 MG tablet Take 20 mg by mouth 2 (two) times daily       NURTEC ODT  75 mg disintegrating tablet Take 75 mg by mouth       polyethylene glycol (MIRALAX ) packet Take 17 g by mouth once  daily Mix in 4-8ounces of fluid prior to taking.       topiramate  (TOPAMAX ) 50 MG tablet Take 200 mg by mouth       ZEPBOUND  10 mg/0.5 mL pen injector Inject 10 mg subcutaneously        No current facility-administered medications on file prior to visit.        Family History           Family History  Problem Relation Age of Onset   Obesity Mother     High blood pressure (Hypertension) Mother     Hyperlipidemia (Elevated cholesterol) Mother     Diabetes Mother     Stroke Father     Skin cancer Father     Obesity Father     High blood pressure (Hypertension) Father     Hyperlipidemia (Elevated cholesterol) Father          Tobacco Use History  Social History         Tobacco Use  Smoking Status Never  Smokeless Tobacco Never  Social History  Social History           Socioeconomic History   Marital status: Married  Tobacco Use   Smoking status: Never   Smokeless tobacco: Never  Vaping Use   Vaping status: Never Used  Substance and Sexual Activity   Alcohol use: Yes   Drug use: Never    Social Drivers of Health           Housing Stability: Unknown (05/04/2023)    Housing Stability Vital Sign     Homeless in the Last Year: No        Objective:           Vitals:    05/04/23 1051  BP: 112/77  Pulse: 91  Temp: 36.7 C (98 F)  SpO2: 98%  Weight: 75 kg (165 lb 6.4 oz)  Height: 175.3 cm (5\' 9" )  PainSc: 1       Exam Gen: NAD Abd: soft Rectal: good tone, anterior tag and R posterior skin tag     Labs, Imaging and Diagnostic Testing:   Procedure: Anoscopy 05/04/2023 Findings: grade 1 R posterior internal hemorrhoid    Patient was placed on the toilet to evaluate this episode of prolapse.  She was noted to have a significantly swollen external hemorrhoid   Assessment and Plan:  Diagnoses and all orders for this visit:   Hemorrhoids, external Patient has a large RP external hemorrhoid that swells significantly with straining.  We  discussed excision today in detail.  We discussed the risk of postoperative pain, bleeding and recurrence.  All questions were answered.  Given that this is obstructing her daily activities, she would like to proceed with hemorrhoidectomy.           Fernande Howells, MD Colon and Rectal Surgery [facility]

## 2023-06-16 NOTE — Anesthesia Postprocedure Evaluation (Signed)
 Anesthesia Post Note  Patient: Systems analyst  Procedure(s) Performed: SINGLE COLUMN HEMORRHOIDECTOMY (Rectum)     Patient location during evaluation: PACU Anesthesia Type: MAC Level of consciousness: awake and alert Pain management: pain level controlled Vital Signs Assessment: post-procedure vital signs reviewed and stable Respiratory status: spontaneous breathing, nonlabored ventilation, respiratory function stable and patient connected to nasal cannula oxygen Cardiovascular status: stable and blood pressure returned to baseline Postop Assessment: no apparent nausea or vomiting Anesthetic complications: no  No notable events documented.  Last Vitals:  Vitals:   06/16/23 1016 06/16/23 1104  BP: 115/78 120/83  Pulse: 93 78  Resp: 15 20  Temp:  (!) 36.2 C  SpO2: 100% 100%    Last Pain:  Vitals:   06/16/23 1104  TempSrc: Temporal  PainSc: 0-No pain                 Willian Harrow

## 2023-06-16 NOTE — Discharge Instructions (Addendum)
 ANORECTAL SURGERY: POST OP INSTRUCTIONS Take your usually prescribed home medications unless otherwise directed. DIET: During the first few hours after surgery sip on some liquids until you are able to urinate.  It is normal to not urinate for several hours after this surgery.  If you feel uncomfortable, please contact the office for instructions.  After you are able to urinate,you may eat, if you feel like it.  Follow a light bland diet the first 24 hours after arrival home, such as soup, liquids, crackers, etc.  Be sure to include lots of fluids daily (6-8 glasses).  Avoid fast food or heavy meals, as your are more likely to get nauseated.  Eat a low fat diet the next few days after surgery.  Limit caffeine intake to 1-2 servings a day. PAIN CONTROL: Pain is best controlled by a usual combination of several different methods TOGETHER: Muscle relaxation: Soak in a warm bath (or Sitz bath) three times a day and after bowel movements.  Continue to do this until all pain is resolved. Over the counter pain medication Prescription pain medication Most patients will experience some swelling and discomfort in the anus/rectal area and incisions.  Heat such as warm towels, sitz baths, warm baths, etc to help relax tight/sore spots and speed recovery.  Some people prefer to use ice, especially in the first couple days after surgery, as it may decrease the pain and swelling, or alternate between ice & heat.  Experiment to what works for you.  Swelling and bruising can take several weeks to resolve.  Pain can take even longer to completely resolve. It is helpful to take an over-the-counter pain medication regularly for the first few weeks.  Choose one of the following that works best for you: Naproxen (Aleve, etc)  Two 220mg  tabs twice a day Ibuprofen  (Advil , etc) Three 200mg  tabs four times a day (every meal & bedtime) A  prescription for pain medication (such as percocet, oxycodone , hydrocodone, etc) should be  given to you upon discharge.  Take your pain medication as prescribed.  If you are having problems/concerns with the prescription medicine (does not control pain, nausea, vomiting, rash, itching, etc), please call us  [phone] to see if we need to switch you to a different pain medicine that will work better for you and/or control your side effect better. If you need a refill on your pain medication, please contact your pharmacy.  They will contact our office to request authorization. Prescriptions will not be filled after 5 pm or on week-ends. KEEP YOUR BOWELS REGULAR and AVOID CONSTIPATION The goal is one to two soft bowel movements a day.  You should at least have a bowel movement every other day. Avoid getting constipated.  Between the surgery and the pain medications, it is common to experience some constipation. This can be very painful after rectal surgery.  Increasing fluid intake and taking a fiber supplement (such as Metamucil, Citrucel, FiberCon, etc) 1-2 times a day regularly will usually help prevent this problem from occurring.  A stool softener like colace is also recommended.  This can be purchased over the counter at your pharmacy.  You can take it up to 3 times a day.  If you do not have a bowel movement after 24 hrs since your surgery, take one does of milk of magnesia.  If you still haven't had a bowel movement 8-12 hours after that dose, take another dose.  If you don't have a bowel movement 48 hrs after surgery,  purchase a Fleets enema from the drug store and administer gently per package instructions.  If you still are having trouble with your bowel movements after that, please call the office for further instructions. If you develop diarrhea or have many loose bowel movements, simplify your diet to bland foods & liquids for a few days.  Stop any stool softeners and decrease your fiber supplement.  Switching to mild anti-diarrheal medications (Kayopectate, Pepto Bismol) can help.   If this worsens or does not improve, please call us .  Wound Care Remove your bandages before your first bowel movement or 8 hours after surgery.     Remove any wound packing material at this tim,e as well.  You do not need to repack the wound unless instructed otherwise.  Wear an absorbent pad or soft cotton gauze in your underwear to catch any drainage and help keep the area clean. You should change this every 2-3 hours while awake. Keep the area clean and dry.  Bathe / shower every day, especially after bowel movements.  Keep the area clean by showering / bathing over the incision / wound.   It is okay to soak an open wound to help wash it.  Wet wipes or showers / gentle washing after bowel movements is often less traumatic than regular toilet paper. You may have some styrofoam-like soft packing in the rectum which will come out with the first bowel movement.  You will often notice bleeding with bowel movements.  This should slow down by the end of the first week of surgery Expect some drainage.  This should slow down, too, by the end of the first week of surgery.  Wear an absorbent pad or soft cotton gauze in your underwear until the drainage stops. Do Not sit on a rubber or pillow ring.  This can make you symptoms worse.  You may sit on a soft pillow if needed.  ACTIVITIES as tolerated:   You may resume regular (light) daily activities beginning the next day--such as daily self-care, walking, climbing stairs--gradually increasing activities as tolerated.  If you can walk 30 minutes without difficulty, it is safe to try more intense activity such as jogging, treadmill, bicycling, low-impact aerobics, swimming, etc. Save the most intensive and strenuous activity for last such as sit-ups, heavy lifting, contact sports, etc  Refrain from any heavy lifting or straining until you are off narcotics for pain control.   You may drive when you are no longer taking prescription pain medication, you can  comfortably sit for long periods of time, and you can safely maneuver your car and apply brakes. You may have sexual intercourse when it is comfortable.  FOLLOW UP in our office Please call [facility] at [phone] to set up an appointment to see your surgeon in the office for a follow-up appointment approximately 3-4 weeks after your surgery. Make sure that you call for this appointment the day you arrive home to insure a convenient appointment time. 10. IF YOU HAVE DISABILITY OR FAMILY LEAVE FORMS, BRING THEM TO THE OFFICE FOR PROCESSING.  DO NOT GIVE THEM TO YOUR DOCTOR.     WHEN TO CALL US  ([phone]) [phone]: Poor pain control Reactions / problems with new medications (rash/itching, nausea, etc)  Fever over 101.5 F (38.5 C) Inability to urinate Nausea and/or vomiting Worsening swelling or bruising Continued bleeding from incision. Increased pain, redness, or drainage from the incision  The clinic staff is available to answer your questions during regular business hours (8:30am-5pm).  Please don't hesitate to call and ask to speak to one of our nurses for clinical concerns.   A surgeon from [facility] is always on call at the hospitals   If you have a medical emergency, go to the nearest emergency room or call 911.    [facility] [address], [address], [place], [place]  [address] ? MAIN: [phone] ? TOLL FREE: [phone] ? FAX [phone] [email or web address]   Post Anesthesia Home Care Instructions  Activity: Get plenty of rest for the remainder of the day. A responsible individual must stay with you for 24 hours following the procedure.  For the next 24 hours, DO NOT: -Drive a car -Advertising copywriter -Drink alcoholic beverages -Take any medication unless instructed by your physician -Make any legal decisions or sign important papers.  Meals: Start with liquid foods such as gelatin or soup. Progress to regular foods  as tolerated. Avoid greasy, spicy, heavy foods. If nausea and/or vomiting occur, drink only clear liquids until the nausea and/or vomiting subsides. Call your physician if vomiting continues.  Special Instructions/Symptoms: Your throat may feel dry or sore from the anesthesia or the breathing tube placed in your throat during surgery. If this causes discomfort, gargle with warm salt water . The discomfort should disappear within 24 hours.  If you had a scopolamine patch placed behind your ear for the management of post- operative nausea and/or vomiting:  1. The medication in the patch is effective for 72 hours, after which it should be removed.  Wrap patch in a tissue and discard in the trash. Wash hands thoroughly with soap and water . 2. You may remove the patch earlier than 72 hours if you experience unpleasant side effects which may include dry mouth, dizziness or visual disturbances. 3. Avoid touching the patch. Wash your hands with soap and water  after contact with the patch.    Information for Discharge Teaching: EXPAREL  (bupivacaine  liposome injectable suspension)   Pain relief is important to your recovery. The goal is to control your pain so you can move easier and return to your normal activities as soon as possible after your procedure. Your physician may use several types of medicines to manage pain, swelling, and more.  Your surgeon or anesthesiologist gave you EXPAREL (bupivacaine ) to help control your pain after surgery.  EXPAREL  is a local anesthetic designed to release slowly over an extended period of time to provide pain relief by numbing the tissue around the surgical site. EXPAREL  is designed to release pain medication over time and can control pain for up to 72 hours. Depending on how you respond to EXPAREL , you may require less pain medication during your recovery. EXPAREL  can help reduce or eliminate the need for opioids during the first few days after surgery when pain  relief is needed the most. EXPAREL  is not an opioid and is not addictive. It does not cause sleepiness or sedation.   Important! A teal colored band has been placed on your arm with the date, time and amount of EXPAREL  you have received. Please leave this armband in place for the full 96 hours following administration, and then you may remove the band. If you return to the hospital for any reason within 96 hours following the administration of EXPAREL , the armband provides important information that your health care providers to know, and alerts them that you have received this anesthetic.    Possible side effects of EXPAREL : Temporary loss of sensation or ability to move  in the area where medication was injected. Nausea, vomiting, constipation Rarely, numbness and tingling in your mouth or lips, lightheadedness, or anxiety may occur. Call your doctor right away if you think you may be experiencing any of these sensations, or if you have other questions regarding possible side effects.  Follow all other discharge instructions given to you by your surgeon or nurse. Eat a healthy diet and drink plenty of water  or other fluids.   Next dose of tylenol  if needed is after 2:30pm Next dose of ibuprofen  if needed is after 2:30pm

## 2023-06-16 NOTE — Anesthesia Preprocedure Evaluation (Signed)
 Anesthesia Evaluation  Patient identified by MRN, date of birth, ID band Patient awake    Reviewed: Allergy & Precautions, NPO status , Patient's Chart, lab work & pertinent test results  Airway Mallampati: I  TM Distance: >3 FB Neck ROM: Full    Dental  (+) Teeth Intact, Dental Advisory Given   Pulmonary    breath sounds clear to auscultation       Cardiovascular negative cardio ROS  Rhythm:Regular Rate:Normal     Neuro/Psych  Headaches PSYCHIATRIC DISORDERS Anxiety Depression       GI/Hepatic negative GI ROS, Neg liver ROS,,,  Endo/Other  negative endocrine ROS    Renal/GU negative Renal ROS     Musculoskeletal negative musculoskeletal ROS (+)    Abdominal   Peds  Hematology negative hematology ROS (+)   Anesthesia Other Findings   Reproductive/Obstetrics                             Anesthesia Physical Anesthesia Plan  ASA: 2  Anesthesia Plan: MAC   Post-op Pain Management: Minimal or no pain anticipated, Gabapentin  PO (pre-op)*, Celebrex  PO (pre-op)* and Tylenol  PO (pre-op)*   Induction: Intravenous  PONV Risk Score and Plan: 3 and Ondansetron , Propofol  infusion and Midazolam   Airway Management Planned: Natural Airway and Nasal Cannula  Additional Equipment: None  Intra-op Plan:   Post-operative Plan: Extubation in OR  Informed Consent: I have reviewed the patients History and Physical, chart, labs and discussed the procedure including the risks, benefits and alternatives for the proposed anesthesia with the patient or authorized representative who has indicated his/her understanding and acceptance.     Dental advisory given  Plan Discussed with: CRNA  Anesthesia Plan Comments:        Anesthesia Quick Evaluation

## 2023-06-16 NOTE — Op Note (Signed)
 06/16/2023  10:00 AM  PATIENT:  Chelsea Gonzalez  38 y.o. female  Patient Care Team: Pcp, No as PCP - General  PRE-OPERATIVE DIAGNOSIS:  EXTERNAL HEMORRHOIDS  POST-OPERATIVE DIAGNOSIS:  EXTERNAL HEMORRHOIDS  PROCEDURE:  SINGLE COLUMN HEMORRHOIDECTOMY    Surgeon(s): Joyce Nixon, MD  ASSISTANT: none   ANESTHESIA:   local and MAC  SPECIMEN:  Source of Specimen:  L posterio-lateral hemorrhoid   DISPOSITION OF SPECIMEN:  PATHOLOGY  COUNTS:  YES  PLAN OF CARE: Discharge to home after PACU  PATIENT DISPOSITION:  PACU - hemodynamically stable.  INDICATION: 38 y.o. F with pain and irritation due to enlarged hemorrhoid not correctable with current bowel regimen   OR FINDINGS: L posterio-lateral hemorrhoid with asssociated fibroepithelial polyp  DESCRIPTION: the patient was identified in the preoperative holding area and taken to the OR where they were laid on the operating room table.  MAC anesthesia was induced without difficulty. The patient was then positioned in prone jackknife position with buttocks gently taped apart.  The patient was then prepped and draped in usual sterile fashion.  SCDs were noted to be in place prior to the initiation of anesthesia. A surgical timeout was performed indicating the correct patient, procedure, positioning and need for preoperative antibiotics.  A rectal block was performed using Marcaine  with epinephrine  mixed with Experel.    I began with a digital rectal exam.  The anal canal was gently dilated.  I then placed a Hill-Ferguson anoscope into the anal canal and evaluated this completely.  The patient appeared to have asymptomatic left-sided hemorrhoid.  It was more in a left posterior location with associated fibroepithelial polyp.  The seem to be what was causing her prolapse and swelling issues.  I incised this with a 10 blade scalpel.  Dissection was carried down through the subcutaneous tissues to the sphincter complex.  I separated the  hemorrhoidal tissue away from the sphincter complex bluntly and divided on both sides.  I then continued down to the distal rectum mucosa and remove the entire hemorrhoid.  The internal anoderm was closed using a running 2-0 chromic suture.  The remaining tissue distal to the dentate line was closed using a running 3-0 chromic suture.  Additional Marcaine  was placed around the incision for postoperative pain control.  Hemostasis was good.  A sterile dressing was applied.  The patient was then awakened from anesthesia and sent to the postanesthesia care unit in stable condition.  All counts were correct per operating or staff.  Fernande Howells, MD  Colorectal and General Surgery [facility]

## 2023-06-16 NOTE — Transfer of Care (Signed)
 Immediate Anesthesia Transfer of Care Note  Patient: Chelsea Gonzalez  Procedure(s) Performed: SINGLE COLUMN HEMORRHOIDECTOMY (Rectum)  Patient Location: PACU  Anesthesia Type:MAC  Level of Consciousness: awake, alert , oriented, and patient cooperative  Airway & Oxygen Therapy: Patient Spontanous Breathing  Post-op Assessment: Report given to RN and Post -op Vital signs reviewed and stable  Post vital signs: Reviewed and stable  Last Vitals:  Vitals Value Taken Time  BP 110/72 06/16/23 1006  Temp    Pulse 99 06/16/23 1009  Resp 18 06/16/23 1009  SpO2 100 % 06/16/23 1009  Vitals shown include unfiled device data.  Last Pain:  Vitals:   06/16/23 0736  TempSrc: Oral  PainSc: 2       Patients Stated Pain Goal: 6 (06/16/23 0736)  Complications: No notable events documented.

## 2023-06-17 ENCOUNTER — Encounter (HOSPITAL_BASED_OUTPATIENT_CLINIC_OR_DEPARTMENT_OTHER): Payer: Self-pay | Admitting: General Surgery

## 2023-06-17 LAB — SURGICAL PATHOLOGY

## 2023-06-30 DIAGNOSIS — F411 Generalized anxiety disorder: Secondary | ICD-10-CM | POA: Diagnosis not present

## 2023-07-06 DIAGNOSIS — F411 Generalized anxiety disorder: Secondary | ICD-10-CM | POA: Diagnosis not present

## 2023-07-11 ENCOUNTER — Other Ambulatory Visit (HOSPITAL_COMMUNITY): Payer: Self-pay

## 2023-07-11 MED ORDER — TIRZEPATIDE-WEIGHT MANAGEMENT 15 MG/0.5ML ~~LOC~~ SOAJ
15.0000 mg | SUBCUTANEOUS | 2 refills | Status: DC
Start: 2023-07-11 — End: 2023-10-15
  Filled 2023-07-11: qty 2, 28d supply, fill #0
  Filled 2023-08-06: qty 2, 28d supply, fill #1
  Filled 2023-09-14 – 2023-09-19 (×2): qty 2, 28d supply, fill #2

## 2023-07-12 ENCOUNTER — Other Ambulatory Visit: Payer: Self-pay

## 2023-07-14 DIAGNOSIS — F411 Generalized anxiety disorder: Secondary | ICD-10-CM | POA: Diagnosis not present

## 2023-07-19 DIAGNOSIS — F411 Generalized anxiety disorder: Secondary | ICD-10-CM | POA: Diagnosis not present

## 2023-07-27 DIAGNOSIS — F411 Generalized anxiety disorder: Secondary | ICD-10-CM | POA: Diagnosis not present

## 2023-08-02 ENCOUNTER — Other Ambulatory Visit (HOSPITAL_COMMUNITY): Payer: Self-pay

## 2023-08-02 DIAGNOSIS — F411 Generalized anxiety disorder: Secondary | ICD-10-CM | POA: Diagnosis not present

## 2023-08-02 MED ORDER — KETOROLAC TROMETHAMINE 10 MG PO TABS
10.0000 mg | ORAL_TABLET | Freq: Four times a day (QID) | ORAL | 0 refills | Status: AC | PRN
  Filled 2023-08-02: qty 20, 5d supply, fill #0

## 2023-08-06 ENCOUNTER — Other Ambulatory Visit (HOSPITAL_COMMUNITY): Payer: Self-pay

## 2023-08-10 DIAGNOSIS — F411 Generalized anxiety disorder: Secondary | ICD-10-CM | POA: Diagnosis not present

## 2023-08-24 DIAGNOSIS — F411 Generalized anxiety disorder: Secondary | ICD-10-CM | POA: Diagnosis not present

## 2023-08-30 DIAGNOSIS — F411 Generalized anxiety disorder: Secondary | ICD-10-CM | POA: Diagnosis not present

## 2023-08-31 ENCOUNTER — Other Ambulatory Visit (HOSPITAL_COMMUNITY): Payer: Self-pay

## 2023-09-08 DIAGNOSIS — N926 Irregular menstruation, unspecified: Secondary | ICD-10-CM | POA: Diagnosis not present

## 2023-09-13 DIAGNOSIS — N951 Menopausal and female climacteric states: Secondary | ICD-10-CM | POA: Diagnosis not present

## 2023-09-13 DIAGNOSIS — R6882 Decreased libido: Secondary | ICD-10-CM | POA: Diagnosis not present

## 2023-09-14 ENCOUNTER — Other Ambulatory Visit (HOSPITAL_BASED_OUTPATIENT_CLINIC_OR_DEPARTMENT_OTHER): Payer: Self-pay

## 2023-09-14 ENCOUNTER — Other Ambulatory Visit (HOSPITAL_COMMUNITY): Payer: Self-pay

## 2023-09-19 ENCOUNTER — Other Ambulatory Visit (HOSPITAL_COMMUNITY): Payer: Self-pay

## 2023-09-19 ENCOUNTER — Other Ambulatory Visit: Payer: Self-pay

## 2023-09-21 DIAGNOSIS — F411 Generalized anxiety disorder: Secondary | ICD-10-CM | POA: Diagnosis not present

## 2023-09-26 ENCOUNTER — Other Ambulatory Visit (HOSPITAL_COMMUNITY): Payer: Self-pay

## 2023-09-26 DIAGNOSIS — F411 Generalized anxiety disorder: Secondary | ICD-10-CM | POA: Diagnosis not present

## 2023-09-26 MED ORDER — AMOXICILLIN-POT CLAVULANATE 875-125 MG PO TABS
1.0000 | ORAL_TABLET | Freq: Two times a day (BID) | ORAL | 0 refills | Status: AC
  Filled 2023-09-26 (×2): qty 20, 10d supply, fill #0

## 2023-10-11 DIAGNOSIS — F411 Generalized anxiety disorder: Secondary | ICD-10-CM | POA: Diagnosis not present

## 2023-10-15 ENCOUNTER — Other Ambulatory Visit (HOSPITAL_COMMUNITY): Payer: Self-pay

## 2023-10-15 MED ORDER — ZEPBOUND 15 MG/0.5ML ~~LOC~~ SOAJ
15.0000 mg | SUBCUTANEOUS | 2 refills | Status: AC
  Filled 2023-10-15: qty 2, 28d supply, fill #0
  Filled 2023-11-10: qty 2, 28d supply, fill #1
  Filled 2023-12-12: qty 2, 28d supply, fill #2

## 2023-10-17 ENCOUNTER — Other Ambulatory Visit: Payer: Self-pay

## 2023-10-18 ENCOUNTER — Other Ambulatory Visit (HOSPITAL_COMMUNITY): Payer: Self-pay

## 2023-10-18 DIAGNOSIS — F411 Generalized anxiety disorder: Secondary | ICD-10-CM | POA: Diagnosis not present

## 2023-10-26 ENCOUNTER — Other Ambulatory Visit (HOSPITAL_COMMUNITY): Payer: Self-pay

## 2023-10-31 DIAGNOSIS — F411 Generalized anxiety disorder: Secondary | ICD-10-CM | POA: Diagnosis not present

## 2023-11-07 DIAGNOSIS — F411 Generalized anxiety disorder: Secondary | ICD-10-CM | POA: Diagnosis not present

## 2023-11-10 ENCOUNTER — Other Ambulatory Visit (HOSPITAL_COMMUNITY): Payer: Self-pay

## 2023-11-14 DIAGNOSIS — F411 Generalized anxiety disorder: Secondary | ICD-10-CM | POA: Diagnosis not present

## 2023-11-21 DIAGNOSIS — F411 Generalized anxiety disorder: Secondary | ICD-10-CM | POA: Diagnosis not present

## 2023-11-28 DIAGNOSIS — F411 Generalized anxiety disorder: Secondary | ICD-10-CM | POA: Diagnosis not present

## 2023-12-01 ENCOUNTER — Other Ambulatory Visit (HOSPITAL_COMMUNITY): Payer: Self-pay

## 2023-12-01 MED ORDER — BUPROPION HCL ER (XL) 300 MG PO TB24
300.0000 mg | ORAL_TABLET | Freq: Every day | ORAL | 1 refills | Status: AC
  Filled 2023-12-01: qty 90, 90d supply, fill #0
  Filled 2024-02-28: qty 90, 90d supply, fill #1

## 2023-12-05 DIAGNOSIS — F411 Generalized anxiety disorder: Secondary | ICD-10-CM | POA: Diagnosis not present

## 2023-12-12 ENCOUNTER — Other Ambulatory Visit (HOSPITAL_COMMUNITY): Payer: Self-pay

## 2023-12-12 DIAGNOSIS — F411 Generalized anxiety disorder: Secondary | ICD-10-CM | POA: Diagnosis not present

## 2023-12-15 ENCOUNTER — Other Ambulatory Visit: Payer: Self-pay

## 2023-12-19 DIAGNOSIS — F411 Generalized anxiety disorder: Secondary | ICD-10-CM | POA: Diagnosis not present

## 2024-01-02 DIAGNOSIS — F411 Generalized anxiety disorder: Secondary | ICD-10-CM | POA: Diagnosis not present

## 2024-01-09 DIAGNOSIS — F411 Generalized anxiety disorder: Secondary | ICD-10-CM | POA: Diagnosis not present

## 2024-02-28 ENCOUNTER — Other Ambulatory Visit (HOSPITAL_COMMUNITY): Payer: Self-pay
